# Patient Record
Sex: Female | Born: 1980 | Race: Black or African American | Hispanic: No | Marital: Married | Smoking: Never smoker
Health system: Southern US, Community
[De-identification: ages and names within clinical notes are randomized; demographics above are authoritative.]

## PROBLEM LIST (undated history)

## (undated) DIAGNOSIS — Z6827 Body mass index (BMI) 27.0-27.9, adult: Secondary | ICD-10-CM

## (undated) HISTORY — DX: Body mass index (BMI) 27.0-27.9, adult: Z68.27

## (undated) HISTORY — PX: TUBAL LIGATION: SHX77

---
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
## 2002-05-17 ENCOUNTER — Inpatient Hospital Stay (HOSPITAL_COMMUNITY): Admission: AD | Admit: 2002-05-17 | Discharge: 2002-05-17 | Payer: Self-pay | Admitting: Obstetrics

## 2002-05-31 ENCOUNTER — Inpatient Hospital Stay (HOSPITAL_COMMUNITY): Admission: AD | Admit: 2002-05-31 | Discharge: 2002-06-02 | Payer: Self-pay | Admitting: Obstetrics

## 2003-09-06 ENCOUNTER — Emergency Department (HOSPITAL_COMMUNITY): Admission: AD | Admit: 2003-09-06 | Discharge: 2003-09-06 | Payer: Self-pay | Admitting: Family Medicine

## 2004-11-10 ENCOUNTER — Emergency Department (HOSPITAL_COMMUNITY): Admission: EM | Admit: 2004-11-10 | Discharge: 2004-11-10 | Payer: Self-pay | Admitting: Emergency Medicine

## 2004-11-12 ENCOUNTER — Ambulatory Visit: Payer: Self-pay | Admitting: Obstetrics & Gynecology

## 2004-11-26 ENCOUNTER — Ambulatory Visit: Payer: Self-pay | Admitting: Obstetrics & Gynecology

## 2004-11-26 ENCOUNTER — Encounter (INDEPENDENT_AMBULATORY_CARE_PROVIDER_SITE_OTHER): Payer: Self-pay | Admitting: Specialist

## 2005-01-18 ENCOUNTER — Inpatient Hospital Stay (HOSPITAL_COMMUNITY): Admission: AD | Admit: 2005-01-18 | Discharge: 2005-01-18 | Payer: Self-pay | Admitting: Obstetrics

## 2005-01-20 ENCOUNTER — Inpatient Hospital Stay (HOSPITAL_COMMUNITY): Admission: AD | Admit: 2005-01-20 | Discharge: 2005-01-20 | Payer: Self-pay | Admitting: Obstetrics

## 2005-01-22 ENCOUNTER — Inpatient Hospital Stay (HOSPITAL_COMMUNITY): Admission: EM | Admit: 2005-01-22 | Discharge: 2005-01-22 | Payer: Self-pay | Admitting: Obstetrics

## 2005-01-24 ENCOUNTER — Inpatient Hospital Stay (HOSPITAL_COMMUNITY): Admission: AD | Admit: 2005-01-24 | Discharge: 2005-01-24 | Payer: Self-pay | Admitting: Obstetrics

## 2005-01-26 ENCOUNTER — Inpatient Hospital Stay (HOSPITAL_COMMUNITY): Admission: AD | Admit: 2005-01-26 | Discharge: 2005-01-26 | Payer: Self-pay | Admitting: Obstetrics

## 2006-02-21 ENCOUNTER — Inpatient Hospital Stay (HOSPITAL_COMMUNITY): Admission: AD | Admit: 2006-02-21 | Discharge: 2006-02-21 | Payer: Self-pay | Admitting: Obstetrics

## 2006-02-26 ENCOUNTER — Inpatient Hospital Stay (HOSPITAL_COMMUNITY): Admission: AD | Admit: 2006-02-26 | Discharge: 2006-02-26 | Payer: Self-pay | Admitting: Obstetrics

## 2006-03-01 ENCOUNTER — Inpatient Hospital Stay (HOSPITAL_COMMUNITY): Admission: AD | Admit: 2006-03-01 | Discharge: 2006-03-04 | Payer: Self-pay | Admitting: Obstetrics

## 2006-05-06 ENCOUNTER — Encounter (INDEPENDENT_AMBULATORY_CARE_PROVIDER_SITE_OTHER): Payer: Self-pay | Admitting: *Deleted

## 2006-05-06 ENCOUNTER — Ambulatory Visit (HOSPITAL_COMMUNITY): Admission: RE | Admit: 2006-05-06 | Discharge: 2006-05-06 | Payer: Self-pay

## 2006-07-30 IMAGING — US US OB COMP LESS 14 WK
1 series · 14 of 28 positions shown · non-contrast
Comparison: none

CLINICAL DATA: 9 weeks pregnant and bleeding.
FIRST TRIMESTER OB ULTRASOUND AND ULTRASOUND TRANSVAGINAL:
Multiple longitudinal and transverse images were obtained.  Gestational age by last menstrual period would be 9 weeks and 6 days.  The uterus appears to have an ill-defined gestational sac approximately 10 x 0.4 x 0.3 cm.   I see no yolk sac or embryo.  Crown rump length cannot be measured.  There is no subchorionic hemorrhage or increased uterine flow.  The right ovary is normal showing a corpus luteum cyst.  The left ovary is normal.   There is no free fluid.

[Series 1: us ob comp less 14 wk · 0.29mm/px · 14 of 48 slices shown]
[im 2/48]
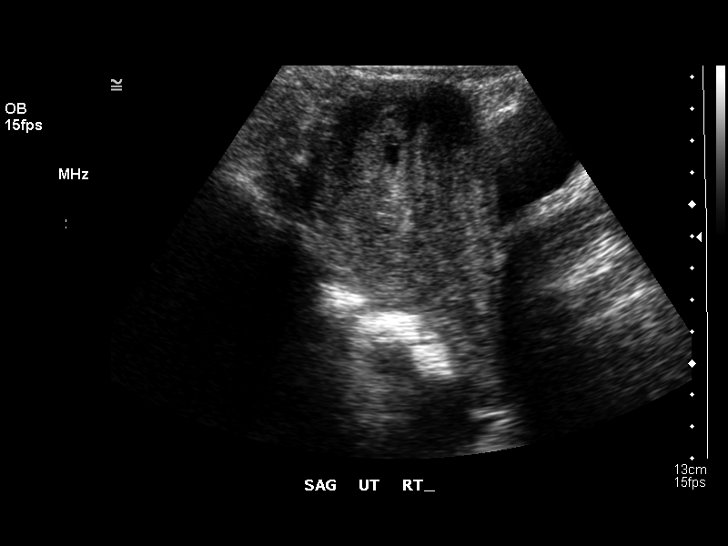
[im 6/48]
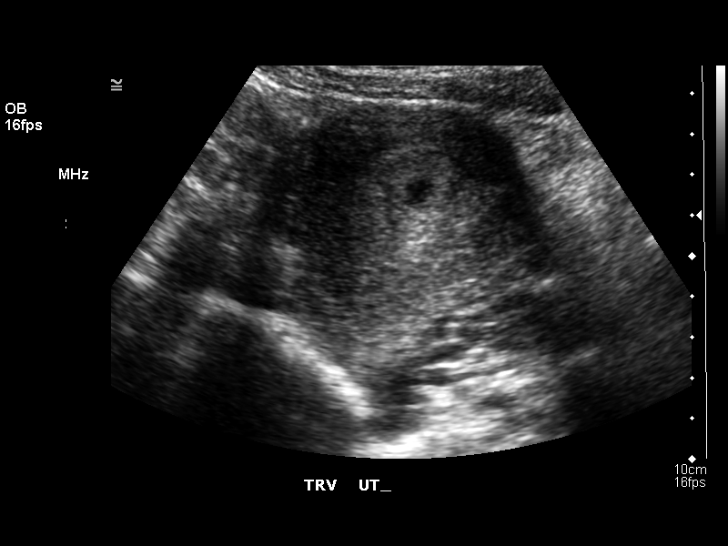
[im 9/48]
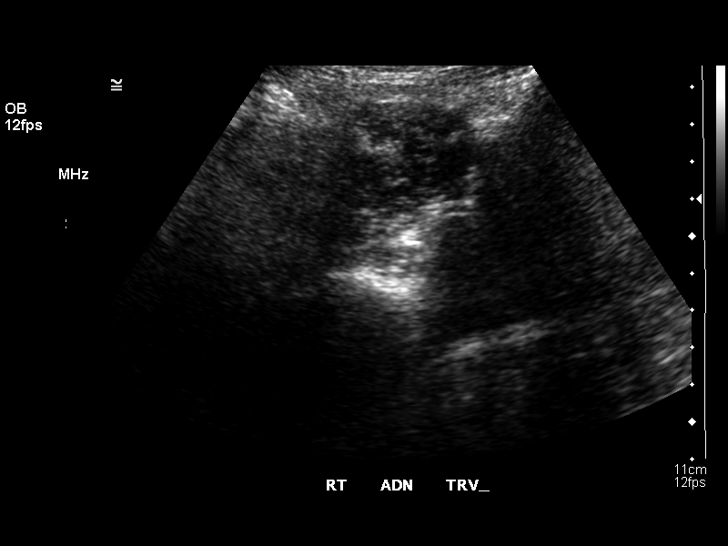
[im 13/48]
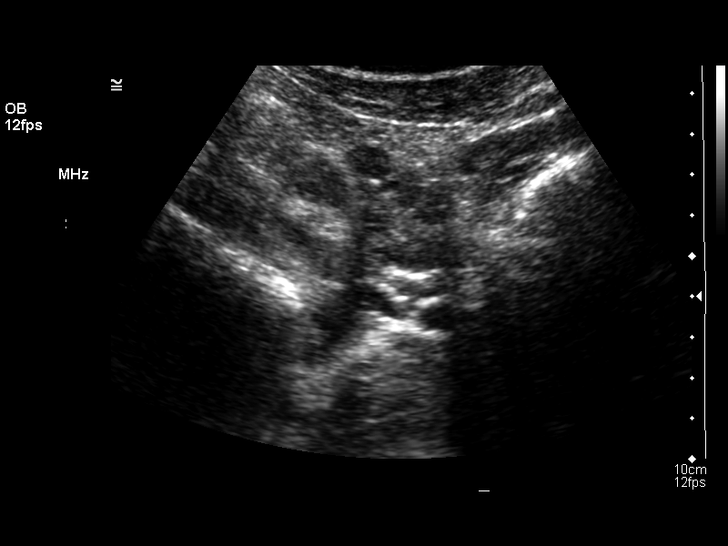
[im 16/48]
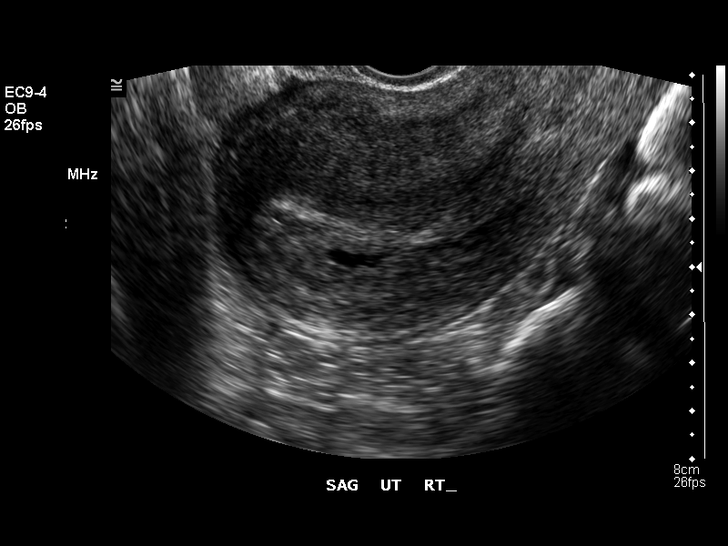
[im 20/48]
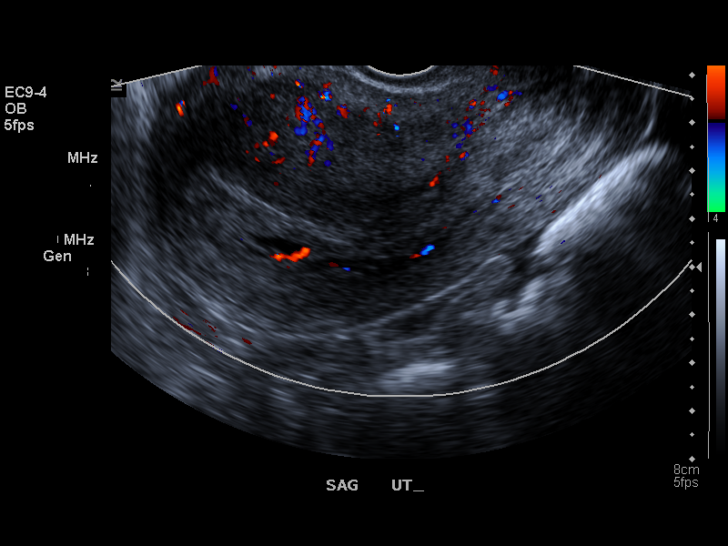
[im 23/48]
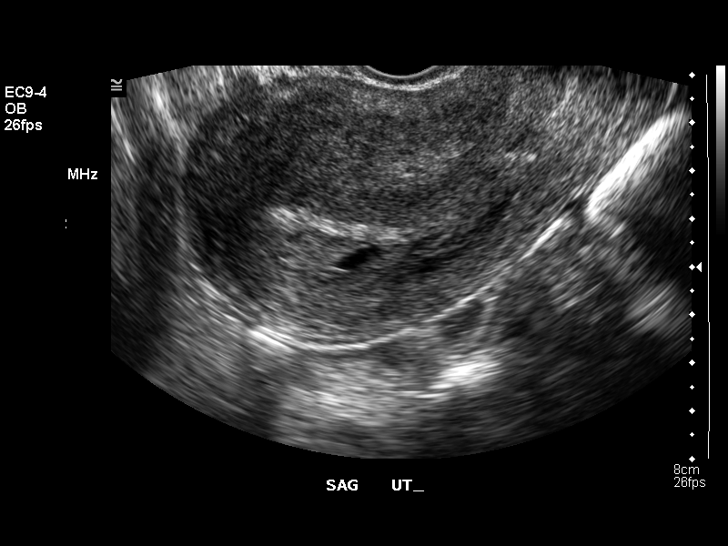
[im 27/48]
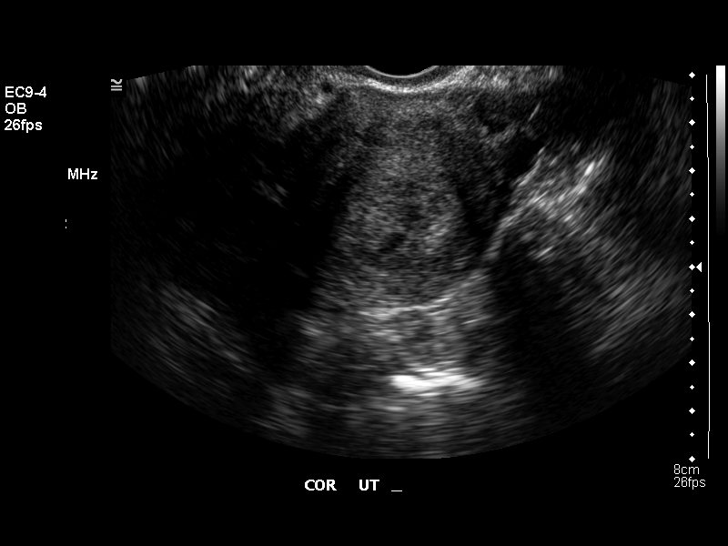
[im 30/48]
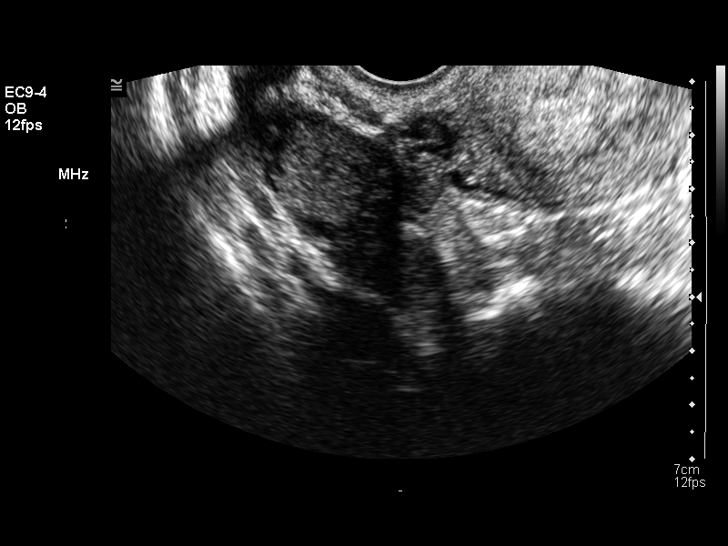
[im 34/48]
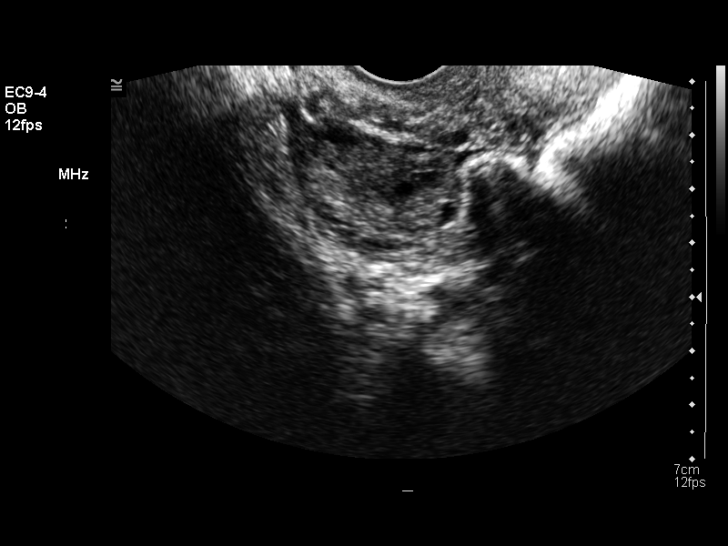
[im 37/48]
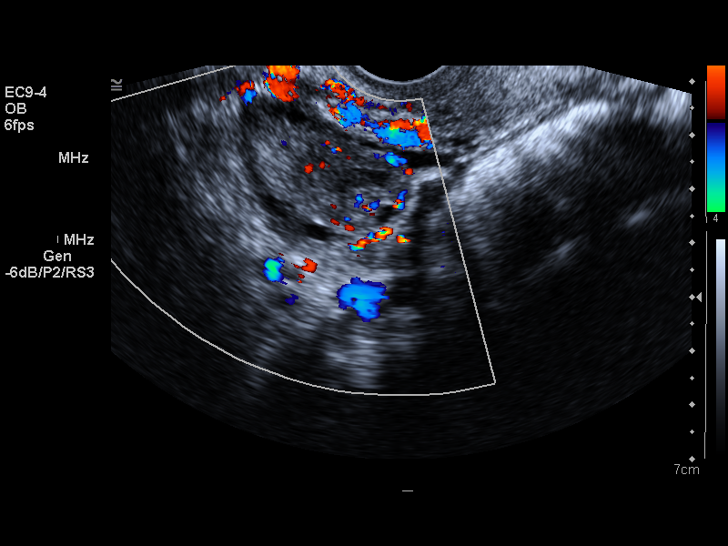
[im 41/48]
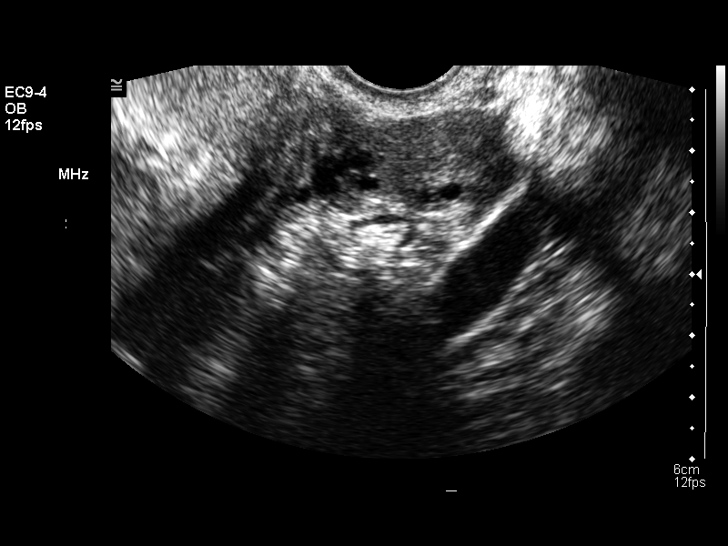
[im 44/48]
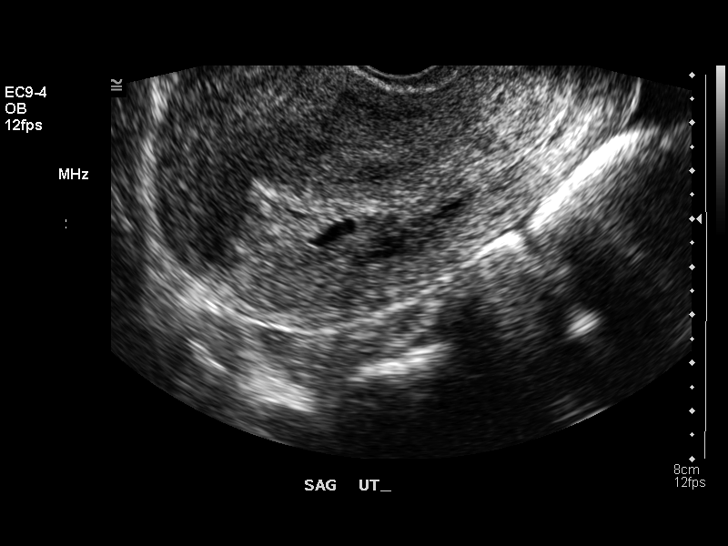
[im 48/48]
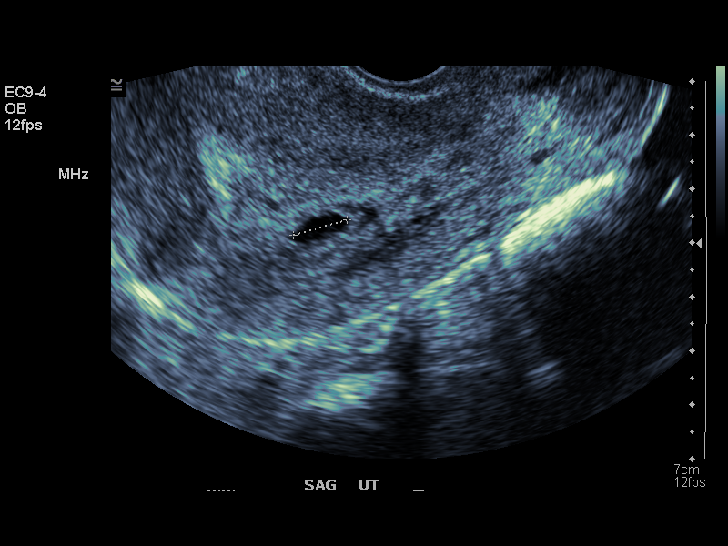

[14 of 28 positions shown; findings below may reference images not displayed]

IMPRESSION: Cannot confirm intrauterine pregnancy based on this ultrasound.   Recommend serial quantitative HCGs as well as serial repeat ultrasounds to determine if this is a blighted ovum or a very early IUP.

## 2006-08-03 IMAGING — US US OB TRANSVAGINAL
1 series · 14 of 28 positions shown · non-contrast
Comparison: none

HISTORY: Pregnant, spotting

[Series 1: us ob transvaginal · 0.23mm/px · 14 of 29 slices shown]
[im 2/29]
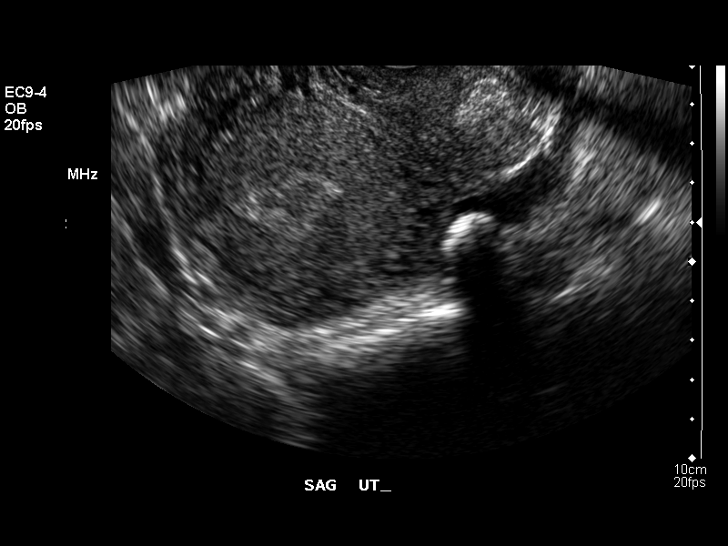
[im 4/29]
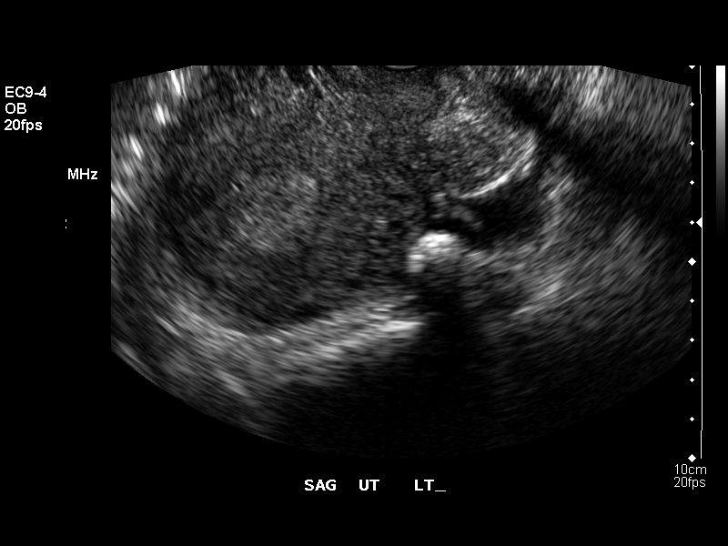
[im 6/29]
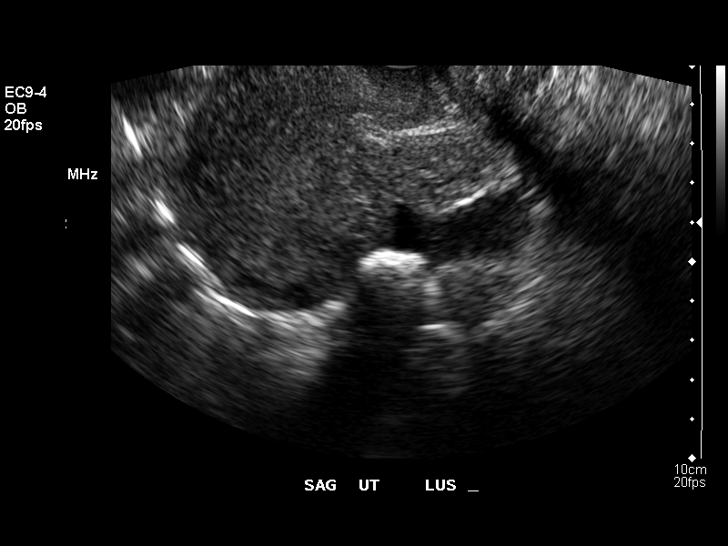
[im 8/29]
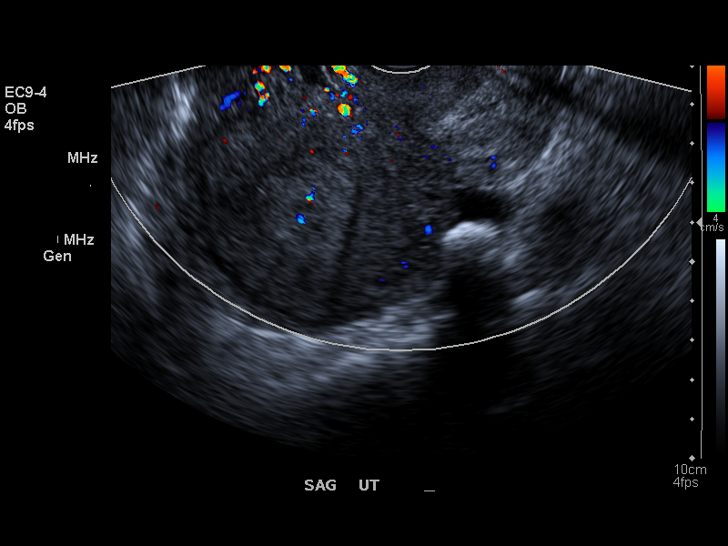
[im 10/29]
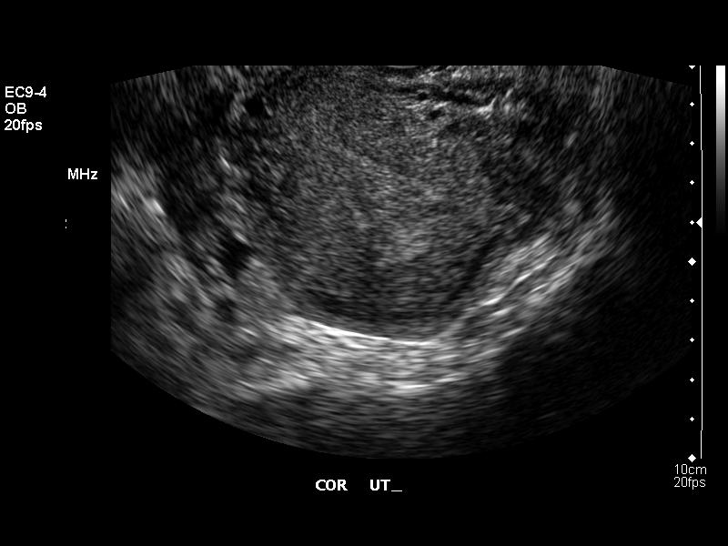
[im 12/29]
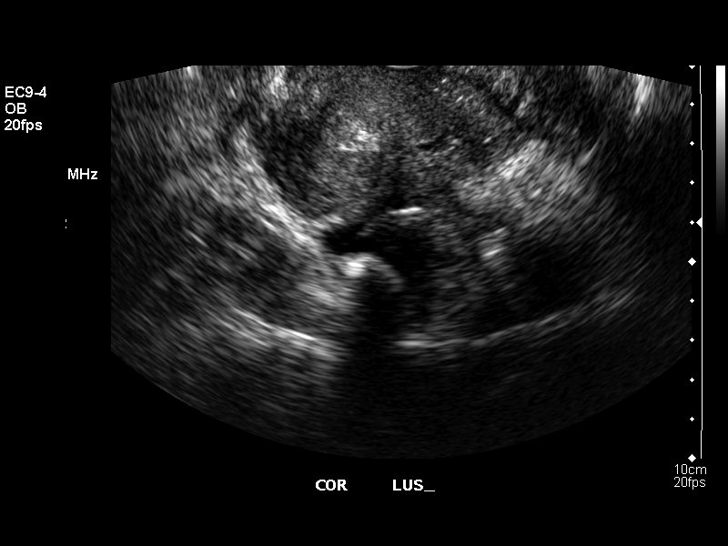
[im 14/29]
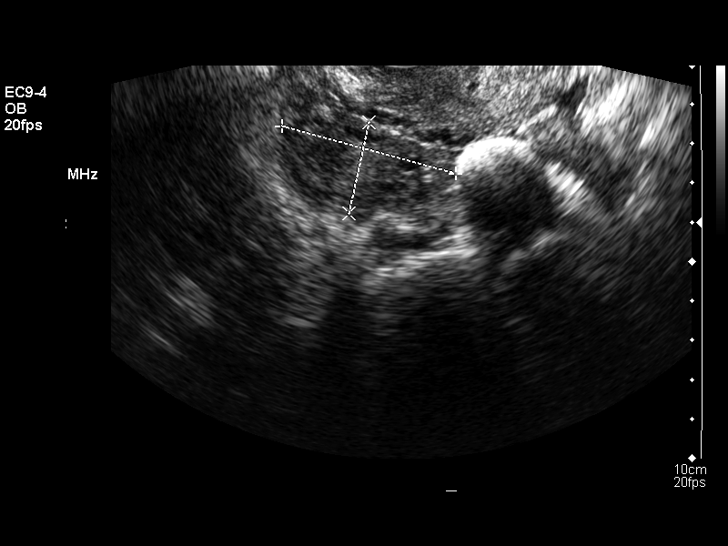
[im 16/29]
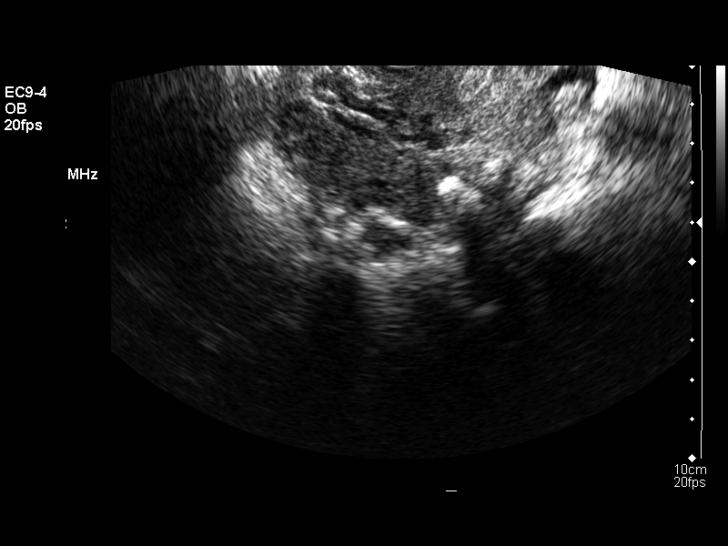
[im 18/29]
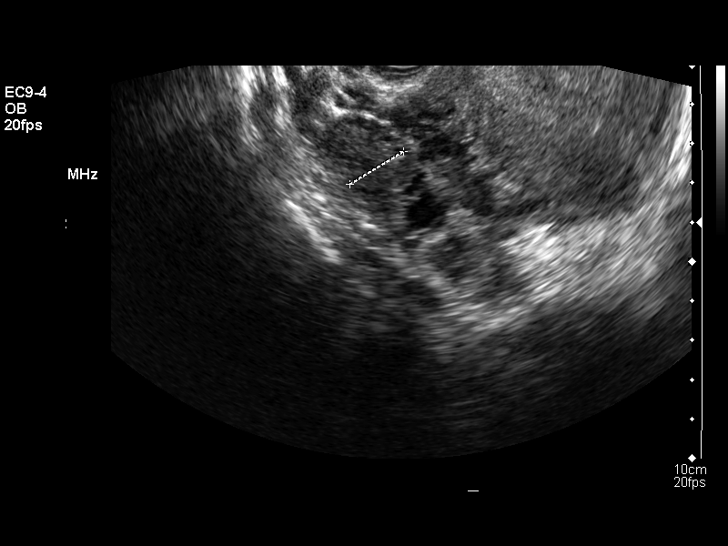
[im 20/29]
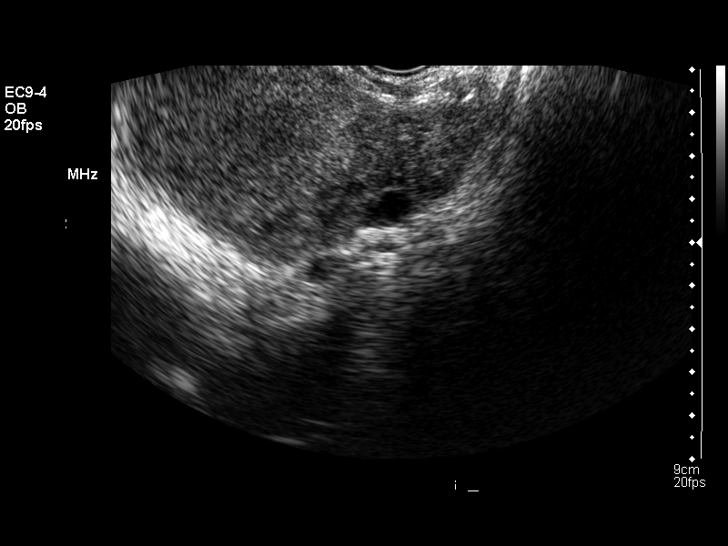
[im 22/29]
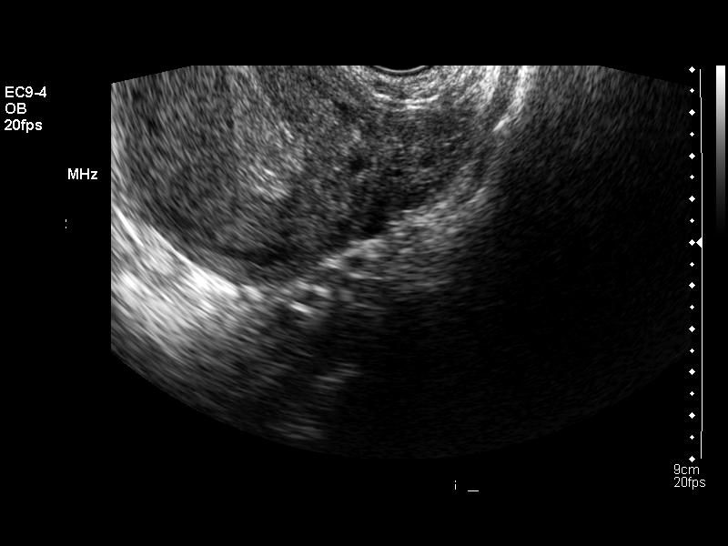
[im 24/29]
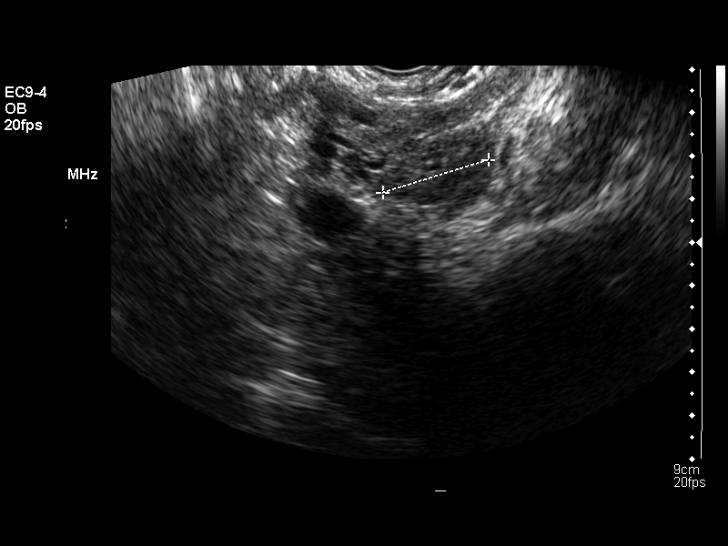
[im 26/29]
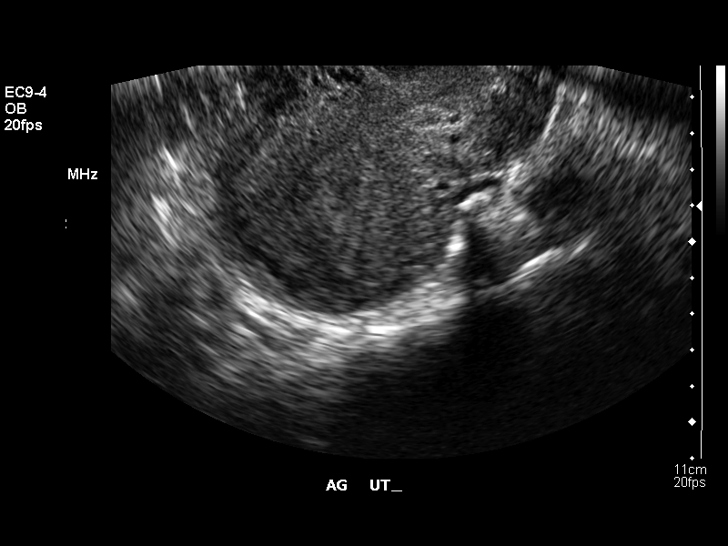
[im 29/29]
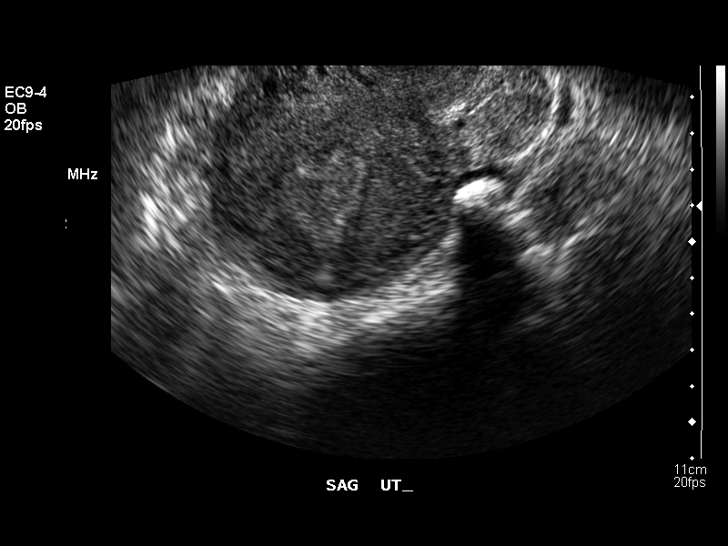

[14 of 28 positions shown; findings below may reference images not displayed]

ULTRASOUND OB TRANSVAGINAL:

Transvaginal sonography of the pregnant pelvis performed.
Comparison 01/22/2005

No gestational sac is seen within the uterus.
The tiny endometrial fluid collection or sac seen on the previous study is no
longer identified.
Endometrial stripe is prominent, measuring up to 2.1 cm thick and showing blood
flow on color Doppler imaging.
No evidence of uterine mass.
Small amount of free pelvic fluid.
Lower uterine segment normal.
No adnexal masses.

Right ovary normal size and morphology, 4.6 x 2.4 x 1.6 cm.
Left ovary normal size and morphology, 4.0 x 2.5 x 2.6 cm.
IMPRESSION: No evidence of each uterine gestation. The tiny sac identified within the
endometrial canal on the previous study is no longer seen, question spontaneous
abortion. Correlation with serial quantitative beta-hCG levels recommended to
confirm spontaneous abortion and exclude ectopic pregnancy.
Small amount of nonspecific free pelvic fluid.

## 2008-08-03 ENCOUNTER — Inpatient Hospital Stay (HOSPITAL_COMMUNITY): Admission: AD | Admit: 2008-08-03 | Discharge: 2008-08-03 | Payer: Self-pay | Admitting: *Deleted

## 2008-08-08 ENCOUNTER — Inpatient Hospital Stay (HOSPITAL_COMMUNITY): Admission: AD | Admit: 2008-08-08 | Discharge: 2008-08-10 | Payer: Self-pay | Admitting: Obstetrics

## 2008-09-28 ENCOUNTER — Emergency Department (HOSPITAL_COMMUNITY): Admission: EM | Admit: 2008-09-28 | Discharge: 2008-09-28 | Payer: Self-pay | Admitting: Family Medicine

## 2010-11-10 ENCOUNTER — Encounter: Payer: Self-pay | Admitting: Obstetrics

## 2010-12-11 ENCOUNTER — Inpatient Hospital Stay (HOSPITAL_COMMUNITY)
Admission: RE | Admit: 2010-12-11 | Discharge: 2010-12-13 | DRG: 767 | Disposition: A | Discharge status: Home / Self Care | Payer: Medicaid Other | Source: Ambulatory Visit | Attending: Obstetrics | Admitting: Obstetrics

## 2010-12-11 DIAGNOSIS — Z302 Encounter for sterilization: Secondary | ICD-10-CM

## 2010-12-11 LAB — CBC
HCT: 32 % — ABNORMAL LOW (ref 36.0–46.0)
MCH: 30 pg (ref 26.0–34.0)
MCHC: 33.1 g/dL (ref 30.0–36.0)
MCV: 90.7 fL (ref 78.0–100.0)
Platelets: 201 10*3/uL (ref 150–400)
RBC: 3.53 MIL/uL — ABNORMAL LOW (ref 3.87–5.11)
RDW: 13.8 % (ref 11.5–15.5)

## 2010-12-11 LAB — RPR: RPR Ser Ql: NONREACTIVE

## 2010-12-12 ENCOUNTER — Other Ambulatory Visit: Payer: Self-pay | Admitting: Obstetrics

## 2010-12-12 LAB — CBC
HCT: 30.2 % — ABNORMAL LOW (ref 36.0–46.0)
Hemoglobin: 9.9 g/dL — ABNORMAL LOW (ref 12.0–15.0)
MCH: 30 pg (ref 26.0–34.0)
MCV: 91.5 fL (ref 78.0–100.0)
RBC: 3.3 MIL/uL — ABNORMAL LOW (ref 3.87–5.11)
WBC: 8.6 10*3/uL (ref 4.0–10.5)

## 2010-12-18 NOTE — Op Note (Signed)
  NAME:  Megan Bryan, Megan Bryan          ACCOUNT NO.:  [id number]  MEDICAL RECORD NO.:  [id number]           PATIENT TYPE:  I  LOCATION:  9148                          FACILITY:  [facility]  PHYSICIAN:  Kathreen Cosier, M.D.DATE OF BIRTH:  09-21-1981  DATE OF PROCEDURE:  12/12/2010 DATE OF DISCHARGE:                              OPERATIVE REPORT   PREOPERATIVE DIAGNOSIS:  Multiparity for postpartum tubal ligation.  POSTOPERATIVE DIAGNOSIS:  Multiparity for postpartum tubal ligation.  SURGEON:  Kathreen Cosier, MD  ANESTHESIA:  Epidural.  PROCEDURE IN DETAIL:  The patient was placed on the operating table in supine position after the epidural dosed.  Abdomen prepped and draped and bladder emptied with Foley catheter.  Midline subumbilical incision 1-inch long was made and carried down to the fascia.  Fascia cleaned, grasped with 2 Kochers and the fascia and the peritoneum with Mayo scissors.  Left tube grasped in midportion with Babcock clamp.  Zero plain suture placed in the mesosalpinx below the portion of tube within the clamp.  This was tied and approximately 1 inch of tube transected. Procedure done in a similar fashion to the other side.  Hemostasis was satisfactory.  Abdomen closed in layers, peritoneum and fascia continuous suture with Dexon, skin closed with subcuticular stitch of 4- 0 Monocryl.  Blood loss minimal.  The patient tolerated the procedure well and taken to recovery room in good condition.          ______________________________ Kathreen Cosier, M.D.     BAM/MEDQ  D:  12/12/2010  T:  12/13/2010  Job:  [id number]  Electronically Signed by Francoise Ceo M.D. on 12/18/2010 08:29:43 AM

## 2010-12-20 ENCOUNTER — Inpatient Hospital Stay (HOSPITAL_COMMUNITY): Admission: AD | Admit: 2010-12-20 | Payer: Self-pay | Admitting: Obstetrics

## 2011-03-07 NOTE — Group Therapy Note (Signed)
NAME:  Megan Bryan, Megan Bryan              ACCOUNT NO.:  [id number]   MEDICAL RECORD NO.:  [id number]          PATIENT TYPE:  WOC   LOCATION:  [facility]                   FACILITY:  [facility]   PHYSICIAN:  Elsie Lincoln, MD      DATE OF BIRTH:  11-Nov-1980   DATE OF SERVICE:  11/26/2004                                    CLINIC NOTE   The patient is a 30 year old G2 P2002, LMP November 11, 2004, who presents  for Pap smear and yearly exams.  The patient denies any problems.  The  patient was here on November 12, 2004, with complaints of nausea, vomiting  and dizziness.  You can see the previous clinic note for all of the details.  She was referred to the [facility] for these problems, where she did not go; she  feels fine today.  Also, she said she had gone to [facility] in the  past and had abnormal Pap smears there.  We called [facility] but  they said she has never been a patient.  When further questioned today she  states it probably was __________, we are now requesting those records.  We  will proceed with Pap smear today.   MEDICAL HISTORY AS FOLLOWS:   PAST MEDICAL HISTORY:  None.   PAST GYN HISTORY:  Two normal spontaneous vaginal deliveries at term with no  problems; no history of ovarian cyst, fibroid tumors; she has had Chlamydia  3-4 years ago; she is a monogamous relationship, using condoms for birth  control.  She is not interested in using any other method, even after  counseling today.   MEDICATIONS:  None.   ALLERGIES:  NONE.   REVIEW OF SYMPTOMS:  Negative.   PHYSICAL EXAM:  Temperature 97.8, pulse 87, blood pressure 108/71, weight  132.1, height 5 feet 3 inches.  ABDOMEN:  Soft, nontender, nondistended.  GENITALIA:  Tanner 5.  VAGINA:  Pink, normal rugae, no blood or discharge.  CERVIX:  Closed, nontender.  UTERUS:  Anteverted, small, nontender.  ADNEXA:  Both ovaries palpated, small and nontender.   ASSESSMENT AND PLAN:  A 30 year old female for yearly  exam.  1.  Pap smear and cultures for GC and Chlamydia were done.  2.  Records requested from __________.  3.  If patient decides to become pregnant again, patient told to take      prenatal vitamins one month prior to conception.  4.  Return to clinic in one year.  5.  Will call patient if Pap smear is abnormal.      KL/MEDQ  D:  11/26/2004  T:  11/26/2004  Job:  [id number]

## 2011-03-07 NOTE — Op Note (Signed)
NAME:  Megan Bryan, Megan Bryan NO.:  [id number]   MEDICAL RECORD NO.:  [id number]          PATIENT TYPE:  AMB   LOCATION:  [facility]                           [facility]   PHYSICIAN:  Kathreen Cosier, M.D.DATE OF BIRTH:  [birth year]   DATE OF PROCEDURE:  05/06/2006  DATE OF DISCHARGE:                                 OPERATIVE REPORT   PREOPERATIVE DIAGNOSIS:  Severe dysplasia of cervix.   PROCEDURE:  Cervical conization.   ANESTHESIA:  General anesthesia.   DESCRIPTION OF PROCEDURE:  Under general anesthesia, patient in lithotomy  position, peritoneum and vagina are prepped and draped, bladder emptied with  straight catheter.  Bimanual exam revealed the uterus to be normal size.  Weighted speculum placed in the vagina.  Cervix was grasped with Allis clamp  and cervix injected with 10 mL of 1% Xylocaine.  A #1 chromic suture was  placed at 3 o'clock and 9 o'clock high upon the lateral aspect of the cervix  for hemostasis.  Then a cold knife cone done in the usual manner and  hemostasis achieved with U sutures around the cervix.  The cervical canal  easily admitted sound.  Patient tolerated the procedure well and was taken  to the recovery room in good condition.           ______________________________  Kathreen Cosier, M.D.     BAM/MEDQ  D:  05/06/2006  T:  05/06/2006  Job:  [id number]

## 2011-03-07 NOTE — Group Therapy Note (Signed)
NAME:  Megan Bryan, Megan Bryan              ACCOUNT NO.:  [id number]   MEDICAL RECORD NO.:  [id number]          PATIENT TYPE:  WOC   LOCATION:  [facility]                   [facility]ACILITY:  [facility]   PHYSICIAN:  Elsie Lincoln, MD      DATE OF BIRTH:  [birth year]   DATE OF SERVICE:  11/12/2004                                    CLINIC NOTE   Patient is a 30 year old female who presents for nausea, vomiting,  dizziness.  She thinks that we are a primary care clinic that deals with  these type things.  I explained that we are a gynecologist and that we will  be addressing her GYN needs.  She is bleeding currently so we did rule out  pregnancy as a cause for dizziness and vomiting.  She had UCG negative.  Her  blood pressure today is 115/78, pulse 88, temperature 98.3.  Patient needs  to come back for a Pap smear in two weeks when she is not bleeding.  She  says she also has a history of abnormal Pap smear and she was recommended an  LEEP by [facility].  She did not get this done.  She was scared.  This  happened almost a year ago.  She requested records to have these available  at her visit in two weeks.  Patient is to go to MAU now.      KL/MEDQ  D:  11/12/2004  T:  11/13/2004  Job:  [id number]

## 2011-04-10 ENCOUNTER — Inpatient Hospital Stay (INDEPENDENT_AMBULATORY_CARE_PROVIDER_SITE_OTHER)
Admission: RE | Admit: 2011-04-10 | Discharge: 2011-04-10 | Disposition: A | Discharge status: Home / Self Care | Payer: Medicaid Other | Source: Ambulatory Visit | Attending: Family Medicine | Admitting: Family Medicine

## 2011-04-10 DIAGNOSIS — N390 Urinary tract infection, site not specified: Secondary | ICD-10-CM

## 2011-04-10 LAB — POCT URINALYSIS DIP (DEVICE)
Glucose, UA: NEGATIVE mg/dL
Ketones, ur: NEGATIVE mg/dL
Nitrite: POSITIVE — AB
Specific Gravity, Urine: 1.02 (ref 1.005–1.030)
pH: 6.5 (ref 5.0–8.0)

## 2011-04-10 LAB — POCT PREGNANCY, URINE: Preg Test, Ur: NEGATIVE

## 2011-07-21 LAB — CBC
HCT: 30.7 — ABNORMAL LOW
Hemoglobin: 10.6 — ABNORMAL LOW
MCHC: 34.4
MCHC: 34.4
MCV: 94.5
Platelets: 194
RBC: 3.25 — ABNORMAL LOW
RBC: 3.58 — ABNORMAL LOW
RDW: 14.3
WBC: 8

## 2011-07-21 LAB — RPR: RPR Ser Ql: NONREACTIVE

## 2014-01-30 LAB — LAB REPORT - SCANNED: Pap: NEGATIVE

## 2016-01-22 ENCOUNTER — Encounter (HOSPITAL_COMMUNITY): Payer: Self-pay | Admitting: Emergency Medicine

## 2016-01-22 ENCOUNTER — Ambulatory Visit (HOSPITAL_COMMUNITY)
Admission: EM | Admit: 2016-01-22 | Discharge: 2016-01-22 | Disposition: A | Discharge status: Home / Self Care | Payer: BLUE CROSS/BLUE SHIELD | Source: Home / Self Care

## 2016-01-22 ENCOUNTER — Other Ambulatory Visit (HOSPITAL_COMMUNITY)
Admission: RE | Admit: 2016-01-22 | Discharge: 2016-01-22 | Disposition: A | Discharge status: Home / Self Care | Payer: BLUE CROSS/BLUE SHIELD | Source: Ambulatory Visit | Attending: Emergency Medicine | Admitting: Emergency Medicine

## 2016-01-22 DIAGNOSIS — J111 Influenza due to unidentified influenza virus with other respiratory manifestations: Secondary | ICD-10-CM

## 2016-01-22 LAB — POCT RAPID STREP A: Streptococcus, Group A Screen (Direct): NEGATIVE

## 2016-01-22 MED ORDER — ACETAMINOPHEN 325 MG PO TABS
650.0000 mg | ORAL_TABLET | Freq: Once | ORAL | Status: AC
  Administered 2016-01-22: 650 mg via ORAL

## 2016-01-22 MED ORDER — OSELTAMIVIR PHOSPHATE 75 MG PO CAPS: 75.0000 mg | ORAL_CAPSULE | Freq: Two times a day (BID) | ORAL | Status: DC

## 2016-01-22 MED ORDER — ACETAMINOPHEN 325 MG PO TABS
ORAL_TABLET | ORAL | Status: AC
  Filled 2016-01-22: qty 2

## 2016-01-22 NOTE — Discharge Instructions (Signed)

## 2016-01-22 NOTE — ED Provider Notes (Signed)
CSN: [id number]     Arrival date & time 01/22/16  1850 History   None    Chief Complaint  Patient presents with  . Sore Throat  . Fever  . Generalized Body Aches   (Consider location/radiation/quality/duration/timing/severity/associated sxs/prior Treatment) Patient is a 35 y.o. female presenting with pharyngitis and fever. The history is provided by the patient. The history is limited by a language barrier.  Sore Throat This is a new problem. The current episode started 2 days ago. The problem occurs constantly. The problem has not changed since onset.Associated symptoms include headaches. The symptoms are aggravated by swallowing. Nothing relieves the symptoms. She has tried water for the symptoms.  Fever Associated symptoms: headaches and sore throat     History reviewed. No pertinent past medical history. Past Surgical History  Procedure Laterality Date  . Tubal ligation     History reviewed. No pertinent family history. Social History  Substance Use Topics  . Smoking status: Never Smoker   . Smokeless tobacco: None  . Alcohol Use: No   OB History    No data available     Review of Systems  Constitutional: Positive for fever.  HENT: Positive for sore throat.   Eyes: Negative.   Respiratory: Negative.   Cardiovascular: Negative.   Endocrine: Negative.   Genitourinary: Negative.   Musculoskeletal: Negative.   Skin: Negative.   Allergic/Immunologic: Negative.   Neurological: Positive for headaches.  Hematological: Negative.   Psychiatric/Behavioral: Negative.     Allergies  Review of patient's allergies indicates no known allergies.  Home Medications   Prior to Admission medications   Not on File   Meds Ordered and Administered this Visit  Medications - No data to display  BP 116/77 mmHg  Pulse 102  Temp(Src) 100.5 F (38.1 C) (Oral)  Resp 16  SpO2 98%  LMP 01/19/2016 (Exact Date) No data found.   Physical Exam  Constitutional: She is oriented to  person, place, and time. She appears well-developed and well-nourished.  Ill appearing with fever  HENT:  Head: Normocephalic and atraumatic.  Right Ear: External ear normal.  Left Ear: External ear normal.  Opx injected w/o exudates  Eyes: Conjunctivae and EOM are normal. Pupils are equal, round, and reactive to light.  Neck: Normal range of motion. Neck supple.  Cardiovascular: Normal rate, regular rhythm and normal heart sounds.   Pulmonary/Chest: Effort normal and breath sounds normal.  Abdominal: Soft. Bowel sounds are normal.  Musculoskeletal: Normal range of motion.  Neurological: She is alert and oriented to person, place, and time.    ED Course  Procedures (including critical care time)  Labs Review Labs Reviewed - No data to display  Imaging Review No results found.   Visual Acuity Review  Right Eye Distance:   Left Eye Distance:   Bilateral Distance:    Right Eye Near:   Left Eye Near:    Bilateral Near:         MDM  Influenza - Tamiflu 75mg  one po bid x 5 days #10. Push po fluids, rest, tylenol and motrin otc prn as directed for fever, arthralgias, and myalgias.  Follow up prn if sx's continue or persist. Tylenol 650mg  here in clinic.   Deatra CanterWilliam J Oxford, FNP 01/22/16 2112

## 2016-01-22 NOTE — ED Notes (Signed)
[facility] a [facility] fever, sore throat and general body aches x 3 days. The patient has tried multiple OTC meds with no relief.

## 2016-01-25 LAB — CULTURE, GROUP A STREP (THRC)

## 2020-06-19 ENCOUNTER — Ambulatory Visit: Payer: Self-pay

## 2020-06-24 ENCOUNTER — Other Ambulatory Visit: Payer: Self-pay | Admitting: Physician Assistant

## 2020-06-24 ENCOUNTER — Encounter: Payer: Self-pay | Admitting: Physician Assistant

## 2020-06-24 DIAGNOSIS — Z6827 Body mass index (BMI) 27.0-27.9, adult: Secondary | ICD-10-CM | POA: Insufficient documentation

## 2020-06-24 NOTE — Progress Notes (Signed)
I connected by phone with Megan Bryan on 06/24/2020 at 2:30 PM to discuss the potential use of a new treatment for mild to moderate COVID-19 viral infection in non-hospitalized patients.  This patient is a 39 y.o. female that meets the FDA criteria for Emergency Use Authorization of COVID monoclonal antibody casirivimab/imdevimab.  Has a (+) direct SARS-CoV-2 viral test result  Has mild or moderate COVID-19   Is NOT hospitalized due to COVID-19  Is within 10 days of symptom onset  Has at least one of the high risk factor(s) for progression to severe COVID-19 and/or hospitalization as defined in EUA.  Specific high risk criteria : BMI > 25   I have spoken and communicated the following to the patient or parent/caregiver regarding COVID monoclonal antibody treatment:  1. FDA has authorized the emergency use for the treatment of mild to moderate COVID-19 in adults and pediatric patients with positive results of direct SARS-CoV-2 viral testing who are 54 years of age and older weighing at least 40 kg, and who are at high risk for progressing to severe COVID-19 and/or hospitalization.  2. The significant known and potential risks and benefits of COVID monoclonal antibody, and the extent to which such potential risks and benefits are unknown.  3. Information on available alternative treatments and the risks and benefits of those alternatives, including clinical trials.  4. Patients treated with COVID monoclonal antibody should continue to self-isolate and use infection control measures (e.g., wear mask, isolate, social distance, avoid sharing personal items, clean and disinfect "high touch" surfaces, and frequent handwashing) according to CDC guidelines.   5. The patient or parent/caregiver has the option to accept or refuse COVID monoclonal antibody treatment.  After reviewing this information with the patient, The patient agreed to proceed with receiving casirivimab\imdevimab infusion  and will be provided a copy of the Fact sheet prior to receiving the infusion.  Sx onset 8/27. Set up for infusion on 9/6 @ 10:30am. Directions given to [facility]. Pt is aware that insurance will be charged an infusion fee. Pt is not vaccinated.   Cline Crock 06/24/2020 2:30 PM

## 2020-06-25 ENCOUNTER — Ambulatory Visit (HOSPITAL_COMMUNITY)
Admission: RE | Admit: 2020-06-25 | Discharge: 2020-06-25 | Disposition: A | Discharge status: Home / Self Care | Payer: HRSA Program | Source: Ambulatory Visit | Attending: Pulmonary Disease | Admitting: Pulmonary Disease

## 2020-06-25 DIAGNOSIS — U071 COVID-19: Secondary | ICD-10-CM | POA: Insufficient documentation

## 2020-06-25 DIAGNOSIS — Z6825 Body mass index (BMI) 25.0-25.9, adult: Secondary | ICD-10-CM | POA: Insufficient documentation

## 2020-06-25 MED ORDER — FAMOTIDINE IN NACL 20-0.9 MG/50ML-% IV SOLN: 20.0000 mg | Freq: Once | INTRAVENOUS | Status: DC | PRN

## 2020-06-25 MED ORDER — SODIUM CHLORIDE 0.9 % IV SOLN
1200.0000 mg | Freq: Once | INTRAVENOUS | Status: AC
  Administered 2020-06-25: 1200 mg via INTRAVENOUS
  Filled 2020-06-25: qty 10

## 2020-06-25 MED ORDER — METHYLPREDNISOLONE SODIUM SUCC 125 MG IJ SOLR: 125.0000 mg | Freq: Once | INTRAMUSCULAR | Status: DC | PRN

## 2020-06-25 MED ORDER — SODIUM CHLORIDE 0.9 % IV SOLN: INTRAVENOUS | Status: DC | PRN

## 2020-06-25 MED ORDER — EPINEPHRINE 0.3 MG/0.3ML IJ SOAJ: 0.3000 mg | Freq: Once | INTRAMUSCULAR | Status: DC | PRN

## 2020-06-25 MED ORDER — DIPHENHYDRAMINE HCL 50 MG/ML IJ SOLN: 50.0000 mg | Freq: Once | INTRAMUSCULAR | Status: DC | PRN

## 2020-06-25 MED ORDER — ALBUTEROL SULFATE HFA 108 (90 BASE) MCG/ACT IN AERS: 2.0000 | INHALATION_SPRAY | Freq: Once | RESPIRATORY_TRACT | Status: DC | PRN

## 2020-06-25 NOTE — Discharge Instructions (Signed)
10 Things You Can Do to Manage Your COVID-19 Symptoms at Home If you have possible or confirmed COVID-19: 1. Stay home from work and school. And stay away from other public places. If you must go out, avoid using any kind of public transportation, ridesharing, or taxis. 2. Monitor your symptoms carefully. If your symptoms get worse, call your healthcare provider immediately. 3. Get rest and stay hydrated. 4. If you have a medical appointment, call the healthcare provider ahead of time and tell them that you have or may have COVID-19. 5. For medical emergencies, call 911 and notify the dispatch personnel that you have or may have COVID-19. 6. Cover your cough and sneezes with a tissue or use the inside of your elbow. 7. Wash your hands often with soap and water for at least 20 seconds or clean your hands with an alcohol-based hand sanitizer that contains at least 60% alcohol. 8. As much as possible, stay in a specific room and away from other people in your home. Also, you should use a separate bathroom, if available. If you need to be around other people in or outside of the home, wear a mask. 9. Avoid sharing personal items with other people in your household, like dishes, towels, and bedding. Clean all surfaces that are touched often, like counters, tabletops, and doorknobs. Use household cleaning sprays or wipes according to the label instructions. What types of side effects do monoclonal antibody drugs cause?  Common side effects  In general, the more common side effects caused by monoclonal antibody drugs include: Allergic reactions, such as hives or itching Flu-like signs and symptoms, including chills, fatigue, fever, and muscle aches and pains Nausea, vomiting Diarrhea Skin rashes Low blood pressure   The CDC is recommending patients who receive monoclonal antibody treatments wait at least 90 days before being vaccinated.  10. Currently, there are no data on the safety and efficacy  of mRNA COVID-19 vaccines in persons who received monoclonal antibodies or convalescent plasma as part of COVID-19 treatment. Based on the estimated half-life of such therapies as well as evidence suggesting that reinfection is uncommon in the 90 days after initial infection, vaccination should be deferred for at least 90 days, as a precautionary measure until additional information becomes available, to avoid interference of the antibody treatment with vaccine-induced immune responses.

## 2020-06-25 NOTE — Progress Notes (Signed)
  Diagnosis: COVID-19  Physician: Wright   Procedure: Covid Infusion Clinic Med: casirivimab\imdevimab infusion - Provided patient with casirivimab\imdevimab fact sheet for patients, parents and caregivers prior to infusion.  Complications: No immediate complications noted.  Discharge: Discharged home   Mathew Postiglione A 06/25/2020  

## 2020-06-29 ENCOUNTER — Other Ambulatory Visit: Payer: Self-pay | Admitting: Sleep Medicine

## 2020-06-29 ENCOUNTER — Other Ambulatory Visit: Payer: BLUE CROSS/BLUE SHIELD

## 2020-06-29 DIAGNOSIS — I471 Supraventricular tachycardia: Secondary | ICD-10-CM

## 2020-07-02 LAB — NOVEL CORONAVIRUS, NAA: SARS-CoV-2, NAA: NOT DETECTED

## 2021-05-28 ENCOUNTER — Encounter (HOSPITAL_BASED_OUTPATIENT_CLINIC_OR_DEPARTMENT_OTHER): Payer: Self-pay | Admitting: Orthopaedic Surgery

## 2021-06-04 NOTE — H&P (Signed)
PREOPERATIVE H&P  Chief Complaint: LEFT KNEE MENISCUS TEAR,ACL TEAR  HPI: Megan Bryan is a 40 y.o. female who is scheduled for, Procedure(s): KNEE ARTHROSCOPY WITH LATERAL MENISECTOMY KNEE ARTHROSCOPY WITH MENISCAL REPAIR ANTERIOR CRUCIATE LIGAMENT (ACL) REPAIR.   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 She had a left ACL tear while playing basketball with the children at school. She is the mother of five children. The patient desires a return to sport and is an active person.  Her symptoms are rated as moderate to severe, and have been worsening.  This is significantly impairing activities of daily living.    Please see clinic note for further details on this patient's care.    She has elected for surgical management.   Past Medical History:  Diagnosis Date   BMI 27.0-27.9,adult    Past Surgical History:  Procedure Laterality Date   TUBAL LIGATION     Social History   Socioeconomic History   Marital status: Married    Spouse name: Not on file   Number of children: Not on file   Years of education: Not on file   Highest education level: Not on file  Occupational History   Not on file  Tobacco Use   Smoking status: Never   Smokeless tobacco: Never  Vaping Use   Vaping Use: Never used  Substance and Sexual Activity   Alcohol use: No   Drug use: Never   Sexual activity: Not on file  Other Topics Concern   Not on file  Social History Narrative   Not on file   Social Determinants of Health   Financial Resource Strain: Not on file  Food Insecurity: Not on file  Transportation Needs: Not on file  Physical Activity: Not on file  Stress: Not on file  Social Connections: Not on file   History reviewed. No pertinent family history. No Known Allergies Prior to Admission medications   Medication Sig Start Date End Date Taking? Authorizing Provider  oseltamivir (TAMIFLU) 75 MG capsule Take 1 capsule (75 mg total)  by mouth 2 (two) times daily. 01/22/16   Deatra Canter, FNP    ROS: All other systems have been reviewed and were otherwise negative with the exception of those mentioned in the HPI and as above.  Physical Exam: General: Alert, no acute distress Cardiovascular: No pedal edema Respiratory: No cyanosis, no use of accessory musculature GI: No organomegaly, abdomen is soft and non-tender Skin: No lesions in the area of chief complaint Neurologic: Sensation intact distally Psychiatric: Patient is competent for consent with normal mood and affect Lymphatic: No axillary or cervical lymphadenopathy  MUSCULOSKELETAL:  On examination there is a positive Lachman. Increased translation.  Well perfused extremity otherwise.   Imaging: MRI demonstrates a bone bruise and left ACL tear.  Assessment: LEFT KNEE MENISCUS TEAR,ACL TEAR  Plan: Plan for Procedure(s): KNEE ARTHROSCOPY WITH LATERAL MENISECTOMY KNEE ARTHROSCOPY WITH MENISCAL REPAIR ANTERIOR CRUCIATE LIGAMENT (ACL) REPAIR  The risks benefits and alternatives were discussed with the patient including but not limited to the risks of nonoperative treatment, versus surgical intervention including infection, bleeding, nerve injury,  blood clots, cardiopulmonary complications, morbidity, mortality, among others, and they were willing to proceed.   The patient acknowledged the explanation, agreed to proceed with the plan and consent was signed.   Operative Plan: Left knee arthroscopy with ACL reconstruction with BTB and meniscus repair vs meniscectomy Discharge Medications: Standard DVT Prophylaxis: Aspirin Physical  Therapy: Outpatient PT Special Discharge needs: Knee immobilizer. IceMan   Vernetta Honey, PA-C  06/04/2021 4:51 PM

## 2021-06-05 ENCOUNTER — Encounter (HOSPITAL_BASED_OUTPATIENT_CLINIC_OR_DEPARTMENT_OTHER)
Admission: RE | Disposition: A | Discharge status: Home / Self Care | Payer: Self-pay | Source: Home / Self Care | Attending: Orthopaedic Surgery

## 2021-06-05 ENCOUNTER — Other Ambulatory Visit: Payer: Self-pay

## 2021-06-05 ENCOUNTER — Ambulatory Visit (HOSPITAL_BASED_OUTPATIENT_CLINIC_OR_DEPARTMENT_OTHER): Payer: 59 | Admitting: Certified Registered Nurse Anesthetist

## 2021-06-05 ENCOUNTER — Ambulatory Visit (HOSPITAL_BASED_OUTPATIENT_CLINIC_OR_DEPARTMENT_OTHER)
Admission: RE | Admit: 2021-06-05 | Discharge: 2021-06-05 | Disposition: A | Discharge status: Home / Self Care | Payer: 59 | Attending: Orthopaedic Surgery | Admitting: Orthopaedic Surgery

## 2021-06-05 ENCOUNTER — Encounter (HOSPITAL_BASED_OUTPATIENT_CLINIC_OR_DEPARTMENT_OTHER): Payer: Self-pay | Admitting: Orthopaedic Surgery

## 2021-06-05 DIAGNOSIS — S83512A Sprain of anterior cruciate ligament of left knee, initial encounter: Secondary | ICD-10-CM | POA: Diagnosis not present

## 2021-06-05 DIAGNOSIS — Y990 Civilian activity done for income or pay: Secondary | ICD-10-CM | POA: Insufficient documentation

## 2021-06-05 DIAGNOSIS — Y9367 Activity, basketball: Secondary | ICD-10-CM | POA: Insufficient documentation

## 2021-06-05 DIAGNOSIS — S83282A Other tear of lateral meniscus, current injury, left knee, initial encounter: Secondary | ICD-10-CM | POA: Diagnosis present

## 2021-06-05 DIAGNOSIS — Y92219 Unspecified school as the place of occurrence of the external cause: Secondary | ICD-10-CM | POA: Insufficient documentation

## 2021-06-05 HISTORY — PX: KNEE ARTHROSCOPY WITH MEDIAL MENISECTOMY: SHX5651

## 2021-06-05 HISTORY — PX: ANTERIOR CRUCIATE LIGAMENT REPAIR: SHX115

## 2021-06-05 HISTORY — PX: KNEE ARTHROSCOPY WITH LATERAL MENISECTOMY: SHX6193

## 2021-06-05 LAB — POCT PREGNANCY, URINE: Preg Test, Ur: NEGATIVE

## 2021-06-05 SURGERY — ARTHROSCOPY, KNEE, WITH LATERAL MENISCECTOMY
Anesthesia: General | Site: Knee | Laterality: Left

## 2021-06-05 MED ORDER — ASPIRIN 81 MG PO CHEW: 81.0000 mg | CHEWABLE_TABLET | Freq: Two times a day (BID) | ORAL | 0 refills | Status: AC

## 2021-06-05 MED ORDER — ONDANSETRON HCL 4 MG/2ML IJ SOLN
INTRAMUSCULAR | Status: DC | PRN
  Administered 2021-06-05: 4 mg via INTRAVENOUS

## 2021-06-05 MED ORDER — PROMETHAZINE HCL 25 MG/ML IJ SOLN: 6.2500 mg | INTRAMUSCULAR | Status: DC | PRN

## 2021-06-05 MED ORDER — ONDANSETRON HCL 4 MG PO TABS: 4.0000 mg | ORAL_TABLET | Freq: Three times a day (TID) | ORAL | 0 refills | Status: AC | PRN

## 2021-06-05 MED ORDER — SODIUM CHLORIDE 0.9 % IR SOLN
Status: DC | PRN
  Administered 2021-06-05: 6000 mL

## 2021-06-05 MED ORDER — DEXAMETHASONE SODIUM PHOSPHATE 10 MG/ML IJ SOLN
INTRAMUSCULAR | Status: AC
  Filled 2021-06-05: qty 1

## 2021-06-05 MED ORDER — DEXAMETHASONE SODIUM PHOSPHATE 4 MG/ML IJ SOLN
INTRAMUSCULAR | Status: DC | PRN
  Administered 2021-06-05 (×2): 5 mg via INTRAVENOUS

## 2021-06-05 MED ORDER — BUPIVACAINE-EPINEPHRINE (PF) 0.5% -1:200000 IJ SOLN
INTRAMUSCULAR | Status: DC | PRN
  Administered 2021-06-05: 30 mL via PERINEURAL

## 2021-06-05 MED ORDER — CEFAZOLIN SODIUM-DEXTROSE 2-4 GM/100ML-% IV SOLN
INTRAVENOUS | Status: AC
  Filled 2021-06-05: qty 100

## 2021-06-05 MED ORDER — ONDANSETRON HCL 4 MG/2ML IJ SOLN
INTRAMUSCULAR | Status: AC
  Filled 2021-06-05: qty 2

## 2021-06-05 MED ORDER — FENTANYL CITRATE (PF) 100 MCG/2ML IJ SOLN
INTRAMUSCULAR | Status: AC
  Filled 2021-06-05: qty 2

## 2021-06-05 MED ORDER — FENTANYL CITRATE (PF) 100 MCG/2ML IJ SOLN
50.0000 ug | Freq: Once | INTRAMUSCULAR | Status: AC
  Administered 2021-06-05: 50 ug via INTRAVENOUS

## 2021-06-05 MED ORDER — ACETAMINOPHEN 500 MG PO TABS: 1000.0000 mg | ORAL_TABLET | Freq: Three times a day (TID) | ORAL | 0 refills | Status: AC

## 2021-06-05 MED ORDER — PROPOFOL 10 MG/ML IV BOLUS
INTRAVENOUS | Status: DC | PRN
  Administered 2021-06-05: 150 mg via INTRAVENOUS

## 2021-06-05 MED ORDER — OXYCODONE HCL 5 MG PO TABS: ORAL_TABLET | ORAL | 0 refills | Status: AC

## 2021-06-05 MED ORDER — FENTANYL CITRATE (PF) 100 MCG/2ML IJ SOLN
INTRAMUSCULAR | Status: DC | PRN
  Administered 2021-06-05 (×2): 50 ug via INTRAVENOUS

## 2021-06-05 MED ORDER — VANCOMYCIN HCL 1000 MG IV SOLR
INTRAVENOUS | Status: DC | PRN
  Administered 2021-06-05: 1000 mg

## 2021-06-05 MED ORDER — LIDOCAINE HCL (CARDIAC) PF 100 MG/5ML IV SOSY
PREFILLED_SYRINGE | INTRAVENOUS | Status: DC | PRN
  Administered 2021-06-05: 60 mg via INTRAVENOUS

## 2021-06-05 MED ORDER — PROPOFOL 10 MG/ML IV BOLUS
INTRAVENOUS | Status: AC
  Filled 2021-06-05: qty 20

## 2021-06-05 MED ORDER — VANCOMYCIN HCL 1000 MG IV SOLR
INTRAVENOUS | Status: AC
  Filled 2021-06-05: qty 40

## 2021-06-05 MED ORDER — GLYCOPYRROLATE 0.2 MG/ML IJ SOLN
INTRAMUSCULAR | Status: DC | PRN
  Administered 2021-06-05: .2 mg via INTRAVENOUS

## 2021-06-05 MED ORDER — CEFAZOLIN SODIUM-DEXTROSE 2-4 GM/100ML-% IV SOLN
2.0000 g | INTRAVENOUS | Status: AC
  Administered 2021-06-05: 2 g via INTRAVENOUS

## 2021-06-05 MED ORDER — LACTATED RINGERS IV SOLN: INTRAVENOUS | Status: DC

## 2021-06-05 MED ORDER — MIDAZOLAM HCL 2 MG/2ML IJ SOLN
INTRAMUSCULAR | Status: AC
  Filled 2021-06-05: qty 2

## 2021-06-05 MED ORDER — MIDAZOLAM HCL 5 MG/5ML IJ SOLN
INTRAMUSCULAR | Status: DC | PRN
  Administered 2021-06-05 (×2): 1 mg via INTRAVENOUS

## 2021-06-05 MED ORDER — METHOCARBAMOL 500 MG PO TABS: 500.0000 mg | ORAL_TABLET | Freq: Three times a day (TID) | ORAL | 0 refills | Status: AC | PRN

## 2021-06-05 MED ORDER — ACETAMINOPHEN 500 MG PO TABS
ORAL_TABLET | ORAL | Status: AC
  Filled 2021-06-05: qty 2

## 2021-06-05 MED ORDER — GLYCOPYRROLATE PF 0.2 MG/ML IJ SOSY
PREFILLED_SYRINGE | INTRAMUSCULAR | Status: AC
  Filled 2021-06-05: qty 1

## 2021-06-05 MED ORDER — EPHEDRINE SULFATE 50 MG/ML IJ SOLN
INTRAMUSCULAR | Status: DC | PRN
  Administered 2021-06-05: 10 mg via INTRAVENOUS

## 2021-06-05 MED ORDER — LIDOCAINE HCL (PF) 2 % IJ SOLN
INTRAMUSCULAR | Status: AC
  Filled 2021-06-05: qty 5

## 2021-06-05 MED ORDER — MIDAZOLAM HCL 2 MG/2ML IJ SOLN
2.0000 mg | Freq: Once | INTRAMUSCULAR | Status: AC
  Administered 2021-06-05: 2 mg via INTRAVENOUS

## 2021-06-05 MED ORDER — FENTANYL CITRATE (PF) 100 MCG/2ML IJ SOLN: 25.0000 ug | INTRAMUSCULAR | Status: DC | PRN

## 2021-06-05 MED ORDER — CELECOXIB 100 MG PO CAPS: 100.0000 mg | ORAL_CAPSULE | Freq: Two times a day (BID) | ORAL | 0 refills | Status: AC

## 2021-06-05 MED ORDER — ACETAMINOPHEN 500 MG PO TABS
1000.0000 mg | ORAL_TABLET | Freq: Once | ORAL | Status: AC
  Administered 2021-06-05: 1000 mg via ORAL

## 2021-06-05 MED ORDER — CLONIDINE HCL (ANALGESIA) 100 MCG/ML EP SOLN
EPIDURAL | Status: DC | PRN
  Administered 2021-06-05: 100 ug

## 2021-06-05 SURGICAL SUPPLY — 84 items
ANCH SUT SWLK 19.1X4.75 VT (Anchor) ×1 IMPLANT
ANCHOR PEEK 4.75X19.1 SWLK C (Anchor) ×2 IMPLANT
APL PRP STRL LF DISP 70% ISPRP (MISCELLANEOUS) ×1
BANDAGE ESMARK 6X9 LF (GAUZE/BANDAGES/DRESSINGS) IMPLANT
BLADE AVERAGE 25MMX9MM (BLADE) ×1
BLADE AVERAGE 25X9 (BLADE) ×2 IMPLANT
BLADE CLIPPER SURG (BLADE) IMPLANT
BLADE SHAVER BONE 5.0MM X 13CM (MISCELLANEOUS) ×1
BLADE SHAVER BONE 5.0X13 (MISCELLANEOUS) ×2 IMPLANT
BLADE SURG 10 STRL SS (BLADE) ×3 IMPLANT
BLADE SURG 15 STRL LF DISP TIS (BLADE) ×1 IMPLANT
BLADE SURG 15 STRL SS (BLADE) ×3
BNDG CMPR 9X6 STRL LF SNTH (GAUZE/BANDAGES/DRESSINGS)
BNDG ELASTIC 6X5.8 VLCR STR LF (GAUZE/BANDAGES/DRESSINGS) ×2 IMPLANT
BNDG ESMARK 6X9 LF (GAUZE/BANDAGES/DRESSINGS)
BONE TUNNEL PLUG CANNULATED (MISCELLANEOUS) ×1 IMPLANT
BURR OVAL 8 FLU 4.0MM X 13CM (MISCELLANEOUS) ×1
BURR OVAL 8 FLU 4.0X13 (MISCELLANEOUS) ×1 IMPLANT
CHLORAPREP W/TINT 26 (MISCELLANEOUS) ×3 IMPLANT
CLOSURE STERI-STRIP 1/2X4 (GAUZE/BANDAGES/DRESSINGS) ×1
CLSR STERI-STRIP ANTIMIC 1/2X4 (GAUZE/BANDAGES/DRESSINGS) ×2 IMPLANT
COLLECTOR GRAFT TISSUE (SYSTAGENIX WOUND MANAGEMENT) ×3
COOLER ICEMAN CLASSIC (MISCELLANEOUS) ×3 IMPLANT
COVER BACK TABLE 60X90IN (DRAPES) ×3 IMPLANT
CUFF TOURN SGL QUICK 34 (TOURNIQUET CUFF) ×3
CUFF TRNQT CYL 34X4.125X (TOURNIQUET CUFF) IMPLANT
CUTTER TENSIONER SUT 2-0 0 FBW (INSTRUMENTS) IMPLANT
DECANTER SPIKE VIAL GLASS SM (MISCELLANEOUS) IMPLANT
DISSECTOR 3.5MM X 13CM CVD (MISCELLANEOUS) IMPLANT
DISSECTOR 4.0MMX13CM CVD (MISCELLANEOUS) ×2 IMPLANT
DRAPE ARTHROSCOPY W/POUCH 90 (DRAPES) ×3 IMPLANT
DRAPE IMP U-DRAPE 54X76 (DRAPES) ×3 IMPLANT
DRAPE TOP ARMCOVERS (MISCELLANEOUS) ×3 IMPLANT
DRAPE U-SHAPE 47X51 STRL (DRAPES) ×3 IMPLANT
ELECT REM PT RETURN 9FT ADLT (ELECTROSURGICAL) ×3
ELECTRODE REM PT RTRN 9FT ADLT (ELECTROSURGICAL) ×1 IMPLANT
FIBERSTICK 2 (SUTURE) IMPLANT
GAUZE SPONGE 4X4 12PLY STRL (GAUZE/BANDAGES/DRESSINGS) ×6 IMPLANT
GLOVE SRG 8 PF TXTR STRL LF DI (GLOVE) ×1 IMPLANT
GLOVE SURG ENC MOIS LTX SZ6.5 (GLOVE) ×5 IMPLANT
GLOVE SURG LTX SZ8 (GLOVE) ×6 IMPLANT
GLOVE SURG UNDER POLY LF SZ6.5 (GLOVE) ×5 IMPLANT
GLOVE SURG UNDER POLY LF SZ8 (GLOVE) ×6
GOWN STRL REUS W/ TWL LRG LVL3 (GOWN DISPOSABLE) ×2 IMPLANT
GOWN STRL REUS W/TWL LRG LVL3 (GOWN DISPOSABLE) ×6
GOWN STRL REUS W/TWL XL LVL3 (GOWN DISPOSABLE) ×5 IMPLANT
GUIDEPIN FLEX PATHFINDER 2.4MM (WIRE) ×1 IMPLANT
IMMOBILIZER KNEE 22 UNIV (SOFTGOODS) IMPLANT
IMMOBILIZER KNEE 24 THIGH 36 (MISCELLANEOUS) IMPLANT
IMMOBILIZER KNEE 24 UNIV (MISCELLANEOUS)
IV NS IRRIG 3000ML ARTHROMATIC (IV SOLUTION) ×12 IMPLANT
KIT TRANSTIBIAL (DISPOSABLE) ×3 IMPLANT
KIT TURNOVER KIT B (KITS) ×3 IMPLANT
KNIFE GRAFT ACL 10MM 5952 (MISCELLANEOUS) ×3 IMPLANT
KNIFE GRAFT ACL 9MM (MISCELLANEOUS) IMPLANT
MANIFOLD NEPTUNE II (INSTRUMENTS) ×3 IMPLANT
NDL SAFETY ECLIPSE 18X1.5 (NEEDLE) ×1 IMPLANT
NEEDLE HYPO 18GX1.5 SHARP (NEEDLE)
NS IRRIG 1000ML POUR BTL (IV SOLUTION) ×3 IMPLANT
PACK ARTHROSCOPY DSU (CUSTOM PROCEDURE TRAY) ×3 IMPLANT
PACK BASIN DAY SURGERY FS (CUSTOM PROCEDURE TRAY) ×3 IMPLANT
PAD COLD SHLDR WRAP-ON (PAD) ×3 IMPLANT
PADDING CAST COTTON 6X4 STRL (CAST SUPPLIES) IMPLANT
PENCIL SMOKE EVACUATOR (MISCELLANEOUS) IMPLANT
PORT APPOLLO RF 90DEGREE MULTI (SURGICAL WAND) ×2 IMPLANT
SCREW SHEATHED INTERF 7X25 (Screw) ×2 IMPLANT
SCREW SHEATHED INTERF 9X20MM (Screw) ×2 IMPLANT
SLEEVE SCD COMPRESS KNEE MED (STOCKING) ×3 IMPLANT
SPONGE T-LAP 4X18 ~~LOC~~+RFID (SPONGE) ×3 IMPLANT
SUT FIBERWIRE #2 38 T-5 BLUE (SUTURE) ×18
SUT MNCRL AB 4-0 PS2 18 (SUTURE) ×3 IMPLANT
SUT VIC AB 0 CT1 27 (SUTURE) ×3
SUT VIC AB 0 CT1 27XBRD ANBCTR (SUTURE) ×1 IMPLANT
SUT VIC AB 3-0 SH 27 (SUTURE) ×6
SUT VIC AB 3-0 SH 27X BRD (SUTURE) ×1 IMPLANT
SUTURE FIBERWR #2 38 T-5 BLUE (SUTURE) IMPLANT
SYR 5ML LL (SYRINGE) ×1 IMPLANT
TISSUE GRAFT COLLECTOR (SYSTAGENIX WOUND MANAGEMENT) IMPLANT
TOWEL GREEN STERILE FF (TOWEL DISPOSABLE) ×6 IMPLANT
TUBE CONNECTING 20'X1/4 (TUBING) ×2
TUBE CONNECTING 20X1/4 (TUBING) ×3 IMPLANT
TUBE SUCTION HIGH CAP CLEAR NV (SUCTIONS) ×3 IMPLANT
TUBING ARTHROSCOPY IRRIG 16FT (MISCELLANEOUS) ×3 IMPLANT
WRAP KNEE MAXI GEL POST OP (GAUZE/BANDAGES/DRESSINGS) ×1 IMPLANT

## 2021-06-05 NOTE — Progress Notes (Signed)
AssistedDr. Turk with left, ultrasound guided, adductor canal block. Side rails up, monitors on throughout procedure. See vital signs in flow sheet. Tolerated Procedure well.  

## 2021-06-05 NOTE — Anesthesia Postprocedure Evaluation (Signed)
Anesthesia Post Note  Patient: Megan Bryan  Procedure(s) Performed: KNEE ARTHROSCOPY WITH LATERAL MENISECTOMY (Left: Knee) KNEE ARTHROSCOPY WITH MEDIAL MENISECTOMY (Left: Knee) ANTERIOR CRUCIATE LIGAMENT (ACL) REPAIR with patellar tendon graft (Left: Knee)     Patient location during evaluation: PACU Anesthesia Type: General Level of consciousness: awake and alert, awake and oriented Pain management: pain level controlled Vital Signs Assessment: post-procedure vital signs reviewed and stable Respiratory status: spontaneous breathing, nonlabored ventilation and respiratory function stable Cardiovascular status: blood pressure returned to baseline and stable Postop Assessment: no apparent nausea or vomiting Anesthetic complications: no   No notable events documented.  Last Vitals:  Vitals:   06/05/21 1045 06/05/21 1100  BP: (!) 143/78 119/74  Pulse: 80 68  Resp: 18 16  Temp:    SpO2: 100% 96%    Last Pain:  Vitals:   06/05/21 1045  TempSrc:   PainSc: 0-No pain                 Cecile Hearing

## 2021-06-05 NOTE — Anesthesia Procedure Notes (Signed)
Procedure Name: LMA Insertion Date/Time: 06/05/2021 8:39 AM Performed by: Cleda Clarks, CRNA Pre-anesthesia Checklist: Patient identified, Emergency Drugs available, Suction available and Patient being monitored Patient Re-evaluated:Patient Re-evaluated prior to induction Oxygen Delivery Method: Circle system utilized Preoxygenation: Pre-oxygenation with 100% oxygen Induction Type: IV induction Ventilation: Mask ventilation without difficulty LMA: LMA inserted LMA Size: 3.0 Number of attempts: 1 Placement Confirmation: positive ETCO2 Tube secured with: Tape Dental Injury: Teeth and Oropharynx as per pre-operative assessment

## 2021-06-05 NOTE — Discharge Instructions (Addendum)
Ramond Marrow MD, MPH Alfonse Alpers, PA-C [facility] [address] [phone] (tel)   [phone] (fax)   POST-OPERATIVE INSTRUCTIONS - ACL RECONSTRUCTION  WOUND CARE You may remove the Operative Dressing on Post-Op Day #3 (72hrs after surgery).   Leave steri strips in place.   If you feel more comfortable with it you can leave all dressings in place till your 1 week follow-up appointment.   KEEP THE INCISIONS CLEAN AND DRY. An ACE wrap may be used to control swelling, do not wrap this too tight.  If the initial ACE wrap feels too tight or constricting you may loosen it. There may be a small amount of fluid/bleeding leaking at the surgical site.  This is normal; the knee is filled with fluid during the procedure and can leak for 24-48hrs after surgery.  You may change/reinforce the bandage as needed.  Use the Cryocuff, GameReady or Ice as often as possible for the first 3-4 days, then as needed for pain relief. Always keep a towel, ACE wrap or other barrier between the cooling unit and your skin.  You may shower on Post-Op Day #3. Gently pat the area dry. Do not soak the knee in water.  Do not go swimming in the pool or ocean until 4 weeks after surgery or when otherwise instructed.  BRACE/AMBULATION Your leg will be placed in a brace post-operatively.  You will need to wear your brace at all times You can remove your brace for hygiene only It should be locked in full extension (0 degrees) if adjustable.   You will be instructed on further bracing after your first visit. Use crutches for comfort but you can put your full weight on the leg as tolerated.  REGIONAL ANESTHESIA (NERVE BLOCKS) - The anesthesia team may have performed a nerve block for you if safe in the setting of your care.  This is a great tool used to minimize pain.  Typically the block may start wearing off overnight.  This can be a challenging period but please utilize your as needed  pain medications to try and manage this period and know it will be a brief transition as the nerve block wears completely   POST-OP MEDICATIONS- Multimodal approach to pain control In general your pain will be controlled with a combination of substances.  Prescriptions unless otherwise discussed are electronically sent to your pharmacy.  This is a carefully made plan we use to minimize narcotic use.     Meloxicam OR Celebrex - Anti-inflammatory medication taken on a scheduled basis Acetaminophen - Non-narcotic pain medicine taken on a scheduled basis  Robaxin - this is a muscle relaxer, take as needed for muscle spasms Oxycodone - This is a strong narcotic, to be used only on an "as needed" basis for SEVERE pain. Aspirin 81mg  - This medicine is used to minimize the risk of blood clots after surgery. Zofran - take as needed for nausea   FOLLOW-UP Please call the office to schedule a follow-up appointment for your incision check if you do not already have one, 7-10 days post-operatively. IF YOU HAVE ANY QUESTIONS, PLEASE FEEL FREE TO CALL OUR OFFICE.  HELPFUL INFORMATION  If you had a block, it will wear off between 8-24 hrs postop typically.  This is period when your pain may go from nearly zero to the pain you would have had post-op without the block.  This is an abrupt transition but nothing dangerous is happening.  You may take  an extra dose of narcotic when this happens.  Keep your leg elevated to decrease swelling, which will then in turn decrease your pain. I would elevate the foot of your bed by putting a couple of couch pillows between your mattress and box spring. I would not keep pillow directly under your ankle.  You must wear the brace locked while sleeping and ambulating until follow-up.   There will be MORE swelling on days 1-3 than there is on the day of surgery.  This also is normal. The swelling will decrease with the anti-inflammatory medication, ice and keeping it elevated.  The swelling will make it more difficult to bend your knee. As the swelling goes down your motion will become easier  You may develop swelling and bruising that extends from your knee down to your calf and perhaps even to your foot over the next week. Do not be alarmed. This too is normal, and it is due to gravity  There may be some numbness adjacent to the incision site. This may last for 6-12 months or longer in some patients and is expected.  You may return to sedentary work/school in the next couple of days when you feel up to it. You will need to keep your leg elevated as much as possible   You should wean off your narcotic medicines as soon as you are able.  Most patients will be off or using minimal narcotics before their first postop appointment.   We suggest you use the pain medication the first night prior to going to bed, in order to ease any pain when the anesthesia wears off. You should avoid taking pain medications on an empty stomach as it will make you nauseous.  Do not drink alcoholic beverages or take illicit drugs when taking pain medications.  It is against the law to drive while taking narcotics. You cannot drive if your Right leg is in brace locked in extension.  Pain medication may make you constipated.  Below are a few solutions to try in this order: Decrease the amount of pain medication if you aren't having pain. Drink lots of decaffeinated fluids. Drink prune juice and/or each dried prunes  If the first 3 don't work start with additional solutions Take Colace - an over-the-counter stool softener Take Senokot - an over-the-counter laxative Take Miralax - a stronger over-the-counter laxative  For more information including helpful videos and documents visit our website:   [email or web address]       Post Anesthesia Home Care Instructions  Activity: Get plenty of rest for the remainder of the day. A responsible individual  must stay with you for 24 hours following the procedure.  For the next 24 hours, DO NOT: -Drive a car -Advertising copywriter -Drink alcoholic beverages -Take any medication unless instructed by your physician -Make any legal decisions or sign important papers.  Meals: Start with liquid foods such as gelatin or soup. Progress to regular foods as tolerated. Avoid greasy, spicy, heavy foods. If nausea and/or vomiting occur, drink only clear liquids until the nausea and/or vomiting subsides. Call your physician if vomiting continues.  Special Instructions/Symptoms: Your throat may feel dry or sore from the anesthesia or the breathing tube placed in your throat during surgery. If this causes discomfort, gargle with warm salt water. The discomfort should disappear within 24 hours.  If you had a scopolamine patch placed behind your ear for the management of post- operative nausea and/or vomiting:  1. The medication in the patch is effective  for 72 hours, after which it should be removed.  Wrap patch in a tissue and discard in the trash. Wash hands thoroughly with soap and water. 2. You may remove the patch earlier than 72 hours if you experience unpleasant side effects which may include dry mouth, dizziness or visual disturbances. 3. Avoid touching the patch. Wash your hands with soap and water after contact with the patch.        Regional Anesthesia Blocks  1. Numbness or the inability to move the "blocked" extremity may last from 3-48 hours after placement. The length of time depends on the medication injected and your individual response to the medication. If the numbness is not going away after 48 hours, call your surgeon.  2. The extremity that is blocked will need to be protected until the numbness is gone and the  Strength has returned. Because you cannot feel it, you will need to take extra care to avoid injury. Because it may be weak, you may have difficulty moving it or using it. You may  not know what position it is in without looking at it while the block is in effect.  3. For blocks in the legs and feet, returning to weight bearing and walking needs to be done carefully. You will need to wait until the numbness is entirely gone and the strength has returned. You should be able to move your leg and foot normally before you try and bear weight or walk. You will need someone to be with you when you first try to ensure you do not fall and possibly risk injury.  4. Bruising and tenderness at the needle site are common side effects and will resolve in a few days.  5. Persistent numbness or new problems with movement should be communicated to the surgeon or the [facility] [phone] [facility] [phone]).     Next dose of Tylenol can be given after 1:40 PM.

## 2021-06-05 NOTE — Op Note (Signed)
Orthopaedic Surgery Operative Note (CSN: [id number])  Megan Bryan  07/05/1981 Date of Surgery: 06/05/2021   Diagnoses:  Left traumatic ACL tear, lateral meniscus tear  Procedure: Left knee arthroscopically assisted ACL reconstruction with BTB autograft Left lateral partial meniscectomy   Operative Finding Exam under anesthesia: Full motion, 5 to 8 degrees of hyperextension, full flexion, Lachman was grossly unstable Suprapatellar pouch: Normal Patellofemoral Compartment: Normal Medial Compartment: Normal Lateral Compartment: Complex posterior lateral meniscus tear, 30% total meniscal volume resected.  Some areas in the central portion of the tibial plateau grade 1 and 2 changes Intercondylar Notch: Complete ACL tear  Successful completion of the planned procedure.  Patient's bone quality is relatively poor in the tibia for her age and activity level.  We did backup our fixation but we had a robust 10 mm graft with good purchase of the femoral screw and reasonable purchase of the tibia with backup swivel lock fixation.  Post-operative plan: The patient will be weightbearing to tolerance in a hinged brace locked in extension.  The patient will be discharged home.  DVT prophylaxis Aspirin 81 mg twice daily for 6 weeks.  Pain control with PRN pain medication preferring oral medicines.  Follow up plan will be scheduled in approximately 7 days for incision check and XR.  Post-Op Diagnosis: Same Surgeons:Primary: Bjorn Pippin, MD Assistants:Caroline McBane PA-C Location: [facility] OR ROOM 6 Anesthesia: General with adductor canal Antibiotics: Ancef 2 g with local vancomycin powder 1 g at the surgical site Tourniquet time: 60 Estimated Blood Loss: Minimal Complications: None Specimens: None Implants: Implant Name Type Inv. Item Serial No. Manufacturer Lot No. LRB No. Used Action  SCREW SHEATHED INTERF 7X25 - [id number] Screw SCREW SHEATHED INTERF 7X25  [employer] [id number] Left 1  Implanted  SCREW SHEATHED INTERF 9X20MM - [id number] Screw SCREW SHEATHED INTERF 9X20MM  [employer] [id number] Left 1 Implanted  ANCHOR PEEK 4.75X19.1 SWLK C - [id number] Anchor ANCHOR PEEK 4.75X19.1 SWLK C  [employer] [id number] Left 1 Implanted    Indications for Surgery:   Megan Bryan is a 40 y.o. female with sports related injury resulting in an ACL tear and lateral meniscus tear.  We talked about the ability to return to high levels of activity as well as protection provided by the ACL and she like to proceed with reconstruction.  Benefits and risks of operative and nonoperative management were discussed prior to surgery with patient/guardian(s) and informed consent form was completed.  Specific risks including infection, need for additional surgery, rerupture, stiffness, anterior knee pain.   Procedure:   The patient was identified properly. Informed consent was obtained and the surgical site was marked. The patient was taken up to suite where general anesthesia was induced. The patient was placed in the supine position with a post against the surgical leg and a nonsterile tourniquet applied. The surgical leg was then prepped and draped usual sterile fashion.  A standard surgical timeout was performed.  2 standard anterior portals were made and diagnostic arthroscopy performed. Please note the findings as noted above.  We began by making an incision along the medial third of the patellar tendon in line with the tendon itself starting at the level of the distal pole of the patella ending 3 cm distal to the insertion on the tubercle. We carried the incision down sharply achieving hemostasis 3 progressed identifying the tissue plane of the peritenon. The created skin flaps medially and laterally taking care to avoid damage to the superficial skin. This  point the peritenon was incised sharply in line with the tendon and again flaps are created exposing the medial and lateral borders of the tendon.  We then took care to ensure that there is appropriate visualization for forearm harvest within our incision using a mobile window technique.  We then used a double bladed scalpel incised the tendon longitudinally 10 mm wide. This incision within the tendon was carried proximal and distally onto the tubercle and proximal wound patella to create a 25 mm bone block from patella and a 27 mm bone block from the tibia.  We made our longitudinal cuts with a saw taking care to not go deeper than 10 mm and rather than make a transverse patella cut we made a bullet style tapered cut at the proximal aspect of the patella to avoid the risk for transverse patella fracture.  The harvest went without issue and graft was taken to the back table.    This point we closed the defect in the patellar tendon after identifying that there was appropriate medial and lateral tendon still intact.   We began arthroscopy and made our lateral and medial portals within our incision on each side of the tendon. Fat pad was resected and diagnostic arthroscopy performed with the findings listed above.   We performed a partial lateral meniscectomy with a shaver and a basket back to stable base.  All meniscal remnants were removed.  The anterior cruciate ligament stump was debrided utilizing a shaver taking great care to preserve the remnant stump on the femur and the tibia for localization of our tunnels. Once the remnant anterior cruciate ligament was removed and we obtained appropriate visualization by performing a small notchplasty and confirmed that we had indeed identify the over-the-top position. We made small marks at the location of the aperture of the tibial and femoral tunnels and double checked our location prior to drilling.  We began with a femoral tunnel.  We had taken care to make a far medial and low anteromedial portal with spinal needle localization to ensure that we can get appropriate position on the lateral wall of the  notch from an anterior medial portal drilling technique.  We used a 7 mm offset guide with the knee at 90 degrees of flexion to mark in the position of the old ACL stump.  We then switched our camera to the medial portal and checked that our position was appropriate compared to the back wall.  At that point we hyperflexed the knee and used the 7 mm offset guide again primarily as a sheath to freehand place our wire at the aforementioned mark taking care to exit anterior to the mid femoral line to avoid posterior wall blowout.  Once the wire was advanced through the skin we clamped it and then placed a 9 mm acorn style reamer by hand ensuring that we did not interfere with the medial femoral condyle.  Once it entered the notch we are able to connect it to a reamer and ream to 35 mm of tunnel depth.  We used an Arthrex GraftNet device to harvest with a shaver any of the bony debris and cleared bony debris from the tunnel to ensure that we had an easy pass.  We again checked from the medial portal to ensure that we only had a 2 mm posterior wall and appropriate position of our tunnel at the 230 position.  At this point we advanced our Beath pin and shuttled a #2 FiberWire for eventual graft  passage.  We turned our attention to the tibial tunnel.  We cleared the old ACL stump soft tissue.  We then were able to use a Arthrex tipped aiming tibial guide set to 55 degrees to place our tibial tunnel ensuring that we were centered using the landmark of the posterior aspect of the anterior horn of the lateral meniscus as well as the previous stump.  We took great care to ensure that our wire was positioned appropriately.  We then reamed with the barrel reamer through our ACL harvest incision taking care to protect the skin.  We harvested bone as we reamed and then completed the reaming into the joint.  Any soft tissue was cleared from the apertures of the tunnel.  We finally checked our tunnel position and apertures  once more and were happy were these and proceeded with graft shuttling.  The graft was shuttled in typical fashion and we were able to visualize entering the femoral tunnel.  We ensured that the cancellous surface of the graft was anterior moving the collagen of the graft as far posterior as possible.  Before advancing the graft into the femoral socket we placed a guidewire for screw fixation.  The graft was then advanced the appropriate depth and we hyperflexed the knee for placement of our screw.  Femoral fixation was with a 7 x 25 mm metal Arthrex screw. We obtained good purchase with the screw. We verified arthroscopically that there is no sign of graft impingement on the notch. We then cycled the knee multiple times and turned our attention to the tibia.  Tibia was fixed with a 9 x 20 mm metal Arthrex screw and the graft was extremely rotated 90 to anteriorize the tendinous portion within the joint. We achieved moderate purchase of the graft and there was minimal mismatch.  We felt the backup fixation was appropriate.  We placed a 4.75 peek swivel lock in the anteromedial cortex of the tibia obtaining good purchase.  At this point a gentle Lachman maneuver was performed and there is a stable endpoint and little translation.  Autograft harvested from left over graft prep as well as reamings was used to bone graft the patella as well as the tibial defects the peritenon was closed.  Incision was closed in multilayer fashion with absorbable suture and Steri-Strips placed. Sterile dressing and knee brace were placed and patient taken to PACU without adverse event.   Alfonse Alpers, PA-C, present and scrubbed throughout the case, critical for completion in a timely fashion, and for retraction, instrumentation, closure.

## 2021-06-05 NOTE — Anesthesia Preprocedure Evaluation (Signed)
Anesthesia Evaluation  Patient identified by MRN, date of birth, ID band Patient awake    Reviewed: Allergy & Precautions, NPO status , Patient's Chart, lab work & pertinent test results  Airway Mallampati: II  TM Distance: >3 FB Neck ROM: Full    Dental  (+) Teeth Intact, Dental Advisory Given   Pulmonary neg pulmonary ROS,    Pulmonary exam normal breath sounds clear to auscultation       Cardiovascular negative cardio ROS Normal cardiovascular exam Rhythm:Regular Rate:Normal     Neuro/Psych negative neurological ROS  negative psych ROS   GI/Hepatic negative GI ROS, Neg liver ROS,   Endo/Other  Obesity   Renal/GU negative Renal ROS     Musculoskeletal negative musculoskeletal ROS (+)   Abdominal   Peds  Hematology negative hematology ROS (+)   Anesthesia Other Findings Day of surgery medications reviewed with the patient.  Reproductive/Obstetrics                             Anesthesia Physical Anesthesia Plan  ASA: 2  Anesthesia Plan: General   Post-op Pain Management:  Regional for Post-op pain   Induction: Intravenous  PONV Risk Score and Plan: 3 and Midazolam, Dexamethasone and Ondansetron  Airway Management Planned: LMA  Additional Equipment:   Intra-op Plan:   Post-operative Plan: Extubation in OR  Informed Consent: I have reviewed the patients History and Physical, chart, labs and discussed the procedure including the risks, benefits and alternatives for the proposed anesthesia with the patient or authorized representative who has indicated his/her understanding and acceptance.     Dental advisory given  Plan Discussed with: CRNA  Anesthesia Plan Comments:         Anesthesia Quick Evaluation

## 2021-06-05 NOTE — Anesthesia Procedure Notes (Addendum)
  Anesthesia Regional Block: Adductor canal block   Pre-Anesthetic Checklist: , timeout performed,  Correct Patient, Correct Site, Correct Laterality,  Correct Procedure, Correct Position, site marked,  Risks and benefits discussed,  Surgical consent,  Pre-op evaluation,  At surgeon's request and post-op pain management  Laterality: Left  Prep: chloraprep       Needles:  Injection technique: Single-shot  Needle Type: Echogenic Needle     Needle Length: 9cm  Needle Gauge: 21     Additional Needles:   Procedures:,,,, ultrasound used (permanent image in chart),,    Narrative:  Start time: 06/05/2021 7:38 AM End time: 06/05/2021 7:44 AM Injection made incrementally with aspirations every 5 mL.  Performed by: Personally  Anesthesiologist: Cecile Hearing, MD  Additional Notes: No pain on injection. No increased resistance to injection. Injection made in 5cc increments.  Good needle visualization.  Patient tolerated procedure well.

## 2021-06-05 NOTE — Interval H&P Note (Signed)
All questions answered

## 2021-06-05 NOTE — Transfer of Care (Signed)
Immediate Anesthesia Transfer of Care Note  Patient: Megan Bryan  Procedure(s) Performed: KNEE ARTHROSCOPY WITH LATERAL MENISECTOMY (Left: Knee) KNEE ARTHROSCOPY WITH MEDIAL MENISECTOMY (Left: Knee) ANTERIOR CRUCIATE LIGAMENT (ACL) REPAIR with patellar tendon graft (Left: Knee)  Patient Location: PACU  Anesthesia Type:General  Level of Consciousness: awake, alert  and oriented  Airway & Oxygen Therapy: Patient Spontanous Breathing and Patient connected to nasal cannula oxygen  Post-op Assessment: Report given to RN and Post -op Vital signs reviewed and stable  Post vital signs: Reviewed and stable  Last Vitals:  Vitals Value Taken Time  BP 127/76 06/05/21 1024  Temp 36.6 C 06/05/21 1024  Pulse 90 06/05/21 1028  Resp 13 06/05/21 1028  SpO2 100 % 06/05/21 1028  Vitals shown include unvalidated device data.  Last Pain:  Vitals:   06/05/21 1024  TempSrc:   PainSc: Asleep         Complications: No notable events documented.

## 2021-06-06 ENCOUNTER — Encounter (HOSPITAL_BASED_OUTPATIENT_CLINIC_OR_DEPARTMENT_OTHER): Payer: Self-pay | Admitting: Orthopaedic Surgery

## 2021-06-07 ENCOUNTER — Other Ambulatory Visit: Payer: Self-pay

## 2021-06-07 ENCOUNTER — Ambulatory Visit: Payer: 59 | Attending: Orthopaedic Surgery

## 2021-06-07 DIAGNOSIS — M6281 Muscle weakness (generalized): Secondary | ICD-10-CM | POA: Insufficient documentation

## 2021-06-07 DIAGNOSIS — R262 Difficulty in walking, not elsewhere classified: Secondary | ICD-10-CM | POA: Insufficient documentation

## 2021-06-07 DIAGNOSIS — M25562 Pain in left knee: Secondary | ICD-10-CM | POA: Diagnosis not present

## 2021-06-07 DIAGNOSIS — M25662 Stiffness of left knee, not elsewhere classified: Secondary | ICD-10-CM | POA: Insufficient documentation

## 2021-06-07 NOTE — Therapy (Signed)
[facility] [address] [place], [place], [address] Phone: [phone]   Fax:  [phone]  Physical Therapy Evaluation  Patient Details  Name: STACI DACK MRN: [id number] Date of Birth: February 01, 1981 Referring Provider (PT): Bjorn Pippin, MD   Encounter Date: 06/07/2021   PT End of Session - 06/07/21 0952     Visit Number 1    Number of Visits 24    Authorization Type [employer]    PT Start Time 0840    PT Stop Time 0938    PT Time Calculation (min) 58 min    Activity Tolerance Patient tolerated treatment well    Behavior During Therapy [facility] for tasks assessed/performed             Past Medical History:  Diagnosis Date   BMI 27.0-27.9,adult     Past Surgical History:  Procedure Laterality Date   ANTERIOR CRUCIATE LIGAMENT REPAIR Left 06/05/2021   Procedure: ANTERIOR CRUCIATE LIGAMENT (ACL) REPAIR with patellar tendon graft;  Surgeon: Bjorn Pippin, MD;  Location: [facility];  Service: Orthopedics;  Laterality: Left;   KNEE ARTHROSCOPY WITH LATERAL MENISECTOMY Left 06/05/2021   Procedure: KNEE ARTHROSCOPY WITH LATERAL MENISECTOMY;  Surgeon: Bjorn Pippin, MD;  Location:  SURGERY CENTER;  Service: Orthopedics;  Laterality: Left;   KNEE ARTHROSCOPY WITH MEDIAL MENISECTOMY Left 06/05/2021   Procedure: KNEE ARTHROSCOPY WITH MEDIAL MENISECTOMY;  Surgeon: Bjorn Pippin, MD;  Location:  SURGERY CENTER;  Service: Orthopedics;  Laterality: Left;   TUBAL LIGATION      There were no vitals filed for this visit.    Subjective Assessment - 06/07/21 0842     Subjective Legacy reports that she was playing basketball and someone came under her making her fall backwards.  This occurred in late June of 2022.  The patient underwent a MRI and then surgery.  They performed BTB ACL repair and lateral meniscal repair on 06/05/21.  The patient reports that she is not making the Oxycotin or muscle  relaxer as she is able to manage.  She is wearing the knee brace locked into extension.  She is sleeping with the knee brace locked into extension per the MD orders.  The patient reports that she is using the crutches and walking without weight through the left foot.  She is icing the knee frequently at home.    Limitations Standing;Walking;House hold activities    Patient Stated Goals To get back to being active, running, and playing basketball.    Currently in Pain? Yes    Pain Score 4    8/10 when coming home from the hosptial   Pain Location Knee    Pain Orientation Left    Pain Descriptors / Indicators Pressure;Pounding    Pain Type Surgical pain    Aggravating Factors  moving the knee    Pain Relieving Factors rest, ice, and medication.                [facility] PT Assessment - 06/07/21 0001       Assessment   Medical Diagnosis s/p L Knee scope lateral meniscectomy, ACL repair, Laeral meniscus repair SX 06/05/21    Referring Provider (PT) Bjorn Pippin, MD    Onset Date/Surgical Date 06/05/21    Hand Dominance Right    Next MD Visit 06/13/21    Prior Therapy none for ACL tear      Precautions   Precautions Knee    Precaution  Comments Meniscual repair / ACLR    Required Braces or Orthoses Other Brace/Splint    Other Brace/Splint hinged knee brace      Restrictions   Weight Bearing Restrictions Yes    LLE Weight Bearing Weight bearing as tolerated    Other Position/Activity Restrictions WBAT with knee brace locked into extension      Balance Screen   Has the patient fallen in the past 6 months Yes    How many times? 1    Has the patient had a decrease in activity level because of a fear of falling?  No    Is the patient reluctant to leave their home because of a fear of falling?  No      Home [employer]ist information centre manager residence    Living Arrangements Spouse/significant other;Children    Type of Home House    Home Layout Two level    Home Equipment  Crutches      Prior Function   Level of Independence Independent    Vocation Full time employment    Vocation Requirements sitting, events    Leisure [employer] work through [employer], [employer] activities      Cognition   Overall Cognitive Status Within Functional Limits for tasks assessed      Observation/Other Assessments   Focus on Therapeutic Outcomes (FOTO)  Knee 34% Predicted 69%      Circumferential Edema   Circumferential - Right 36 cm   knee joint line   Circumferential - Left  38 cm   knee joint line     ROM / Strength   AROM / PROM / Strength AROM;PROM;Strength      AROM   Right Knee Extension 10   hyperextension   Right Knee Flexion 130    Left Knee Extension 0    Left Knee Flexion 55      Strength   Overall Strength Comments Right Able to perform SLR without extensor lag; Left able to perform SLR with 5 deg extensor lag      Ambulation/Gait   Ambulation/Gait Yes    Ambulation/Gait Assistance 6: Modified independent (Device/Increase time)    Ambulation Distance (Feet) 100 Feet    Assistive device Crutches    Gait Pattern --   NWB left LE with knee brace locked into extension                       Objective measurements completed on examination: See above findings.       [facility] Adult PT Treatment/Exercise - 06/07/21 0001       Ambulation/Gait   Stairs Assistance 5: Supervision    Stairs Assistance Details (indicate cue type and reason) verbal cuing PWB LLE w/ brace locked into extension    Stair Management Technique Step to pattern;With crutches    Height of Stairs 6      Exercises   Exercises Knee/Hip      Knee/Hip Exercises: Stretches   Passive Hamstring Stretch Left;20 seconds;3 reps    Gastroc Stretch 20 seconds;3 reps;Left      Knee/Hip Exercises: Supine   Quad Sets Left;15 reps    Quad Sets Limitations 5 second hold    Heel Slides AROM;Left;15 reps    Straight Leg Raises Left;2 sets;10 reps    Straight Leg Raises  Limitations 3 second hold      Knee/Hip Exercises: Sidelying   Hip ABduction 2 sets;Left;10 reps    Hip ABduction Limitations SLR - abd  PT Education - 06/07/21 0951     Education Details POC, FOTO, HEP, stairs, S&S and prevention of blood clots    Person(s) Educated Patient    Methods Explanation;Demonstration;Handout    Comprehension Verbalized understanding;Returned demonstration              PT Short Term Goals - 06/07/21 0859       PT SHORT TERM GOAL #1   Title The patient will be independent in an initial ACLR home exercise program.    Baseline none    Time 2    Period Weeks    Status New    Target Date 06/28/21      PT SHORT TERM GOAL #2   Title The patient will present with improvement of left knee flexion active motion to 90 degrees for transfers and ambulation.    Baseline 55 deg    Time 4    Period Weeks    Status New    Target Date 07/05/21      PT SHORT TERM GOAL #3   Title The patient will be able to perform 30 reps of left SLR without an extensor lag for transition to waking without an AD.    Baseline 5 deg extensor lag    Time 4    Period Weeks    Status New    Target Date 07/05/21               PT Long Term Goals - 06/07/21 0927       PT LONG TERM GOAL #1   Title The patient will have an improved FOTO score to 69% or better for return to premorbid activities.    Baseline 34%    Time 12    Period Weeks    Status New    Target Date 08/30/21      PT LONG TERM GOAL #2   Title The patient will be able to perform an eight inch step down with no compensation and good left knee eccentric control for stairs    Baseline step to pattern w/ crutches    Time 12    Period Weeks    Status New    Target Date 08/30/21      PT LONG TERM GOAL #3   Title The patient will be able to perform a single leg stance on foam x 30 second or greater for return to community work with childern.    Baseline unable to test     Time 12    Period Weeks    Status New    Target Date 08/30/21      PT LONG TERM GOAL #4   Title The patient will be able to squat to full depth with equal weight bearing for lifting.    Baseline unable to test    Time 12    Period Weeks    Status New    Target Date 08/30/21      PT LONG TERM GOAL #5   Title The patient will be able to complete a functonal hop test or Y balance testing and score a 90% or better for return to recreational and community activities.    Baseline not tested    Time 18    Period Weeks    Status New    Target Date 10/11/21                    Plan - 06/07/21 0900     Clinical Impression  Statement Tani is a 41 y/o female who underwent left knee ACL repair with a patella tendon autograft and lateral meniscal repair.  Per the surgical report that patient is allowed WBAT with the hinged knee brace locked into extension.  The patient arrived to therapy ambulating NWB with bilateral axillary crutches and the knee brace locked into extension.  The patient currently has 0 to 55 degrees of left knee active range of motion.  She is able to perform a left straight leg raise with a 5 degree extensor lag.  The patient's bedroom is on the second level.  Provided training for stair performance with use of bilateral axillary crutches.  Advised the patient to put PWB through the left LE for safety.  Recommend physical therpay to return to premorbid state.    Examination-Activity Limitations Bathing;Locomotion Level;Transfers;Bed Mobility;Caring for Others;Carry;Sleep;Dressing;Lift;Stand;Stairs;Squat    Examination-Participation Restrictions Cleaning;Church;Meal Prep;Occupation;Driving;Yard Work;Volunteer;Laundry;Shop;Community Activity    Stability/Clinical Decision Making Stable/Uncomplicated    Clinical Decision Making Low    Rehab Potential Good    PT Frequency 2x / week    PT Duration 12 weeks    PT Treatment/Interventions ADLs/Self Care Home  Management;Aquatic Therapy;Cryotherapy;Electrical Stimulation;Moist Heat;Gait training;Stair training;Functional mobility training;Therapeutic activities;Therapeutic exercise;Balance training;Neuromuscular re-education;Manual techniques;Patient/family education;Passive range of motion;Dry needling;Joint Manipulations;Spinal Manipulations;Taping    PT Next Visit Plan review HEP, quadriceps strengthening, ROM per the protocol, hip strengthening, stretches    PT Home Exercise Plan Access Code: [email or web address]    Consulted and Agree with Plan of Care Patient             Patient will benefit from skilled therapeutic intervention in order to improve the following deficits and impairments:  Abnormal gait, Decreased range of motion, Difficulty walking, Pain, Decreased balance, Impaired flexibility, Improper body mechanics, Decreased mobility, Decreased strength, Increased edema  Visit Diagnosis: Acute pain of left knee  Difficulty in walking, not elsewhere classified  Muscle weakness (generalized)  Decreased range of motion of left knee     Problem List Patient Active Problem List   Diagnosis Date Noted   BMI 27.0-27.9,adult    Sharol Roussel, PT, DPT, OCS, Crt. DN  Robet Leu 06/07/2021, 12:04 PM  [facility] [address] [place], [place], [address] Phone: [phone]   Fax:  [phone]  Name: EVIN CHIRCO MRN: [id number] Date of Birth: April 26, 1981

## 2021-06-07 NOTE — Patient Instructions (Signed)
Access Code: [email or web address] URL: [email or web address] Date: 06/07/2021 Prepared by: Sharol Roussel  Exercises Supine Single Leg Ankle Pumps - 4 x daily - 7 x weekly - 1 sets - 20 reps Long Sitting Quad Set - 2 x daily - 7 x weekly - 1 sets - 15 reps - 5 hold Small Range Straight Leg Raise - 2 x daily - 7 x weekly - 3 sets - 10 reps - 3 hold Sidelying Hip Abduction at Wall - 1 x daily - 7 x weekly - 2 sets - 10 reps Supine Heel Slide - 2 x daily - 7 x weekly - 2 sets - 10 reps Seated Table Hamstring Stretch - 2 x daily - 7 x weekly - 1 sets - 3 reps - 20 hold Long Sitting Calf Stretch with Strap - 2 x daily - 7 x weekly - 1 sets - 3 reps - 20 hold

## 2021-06-10 ENCOUNTER — Ambulatory Visit: Payer: 59 | Admitting: Physical Therapy

## 2021-06-10 ENCOUNTER — Encounter: Payer: Self-pay | Admitting: Physical Therapy

## 2021-06-10 DIAGNOSIS — R262 Difficulty in walking, not elsewhere classified: Secondary | ICD-10-CM

## 2021-06-10 DIAGNOSIS — M25562 Pain in left knee: Secondary | ICD-10-CM | POA: Diagnosis not present

## 2021-06-10 DIAGNOSIS — M6281 Muscle weakness (generalized): Secondary | ICD-10-CM

## 2021-06-10 DIAGNOSIS — M25662 Stiffness of left knee, not elsewhere classified: Secondary | ICD-10-CM

## 2021-06-10 NOTE — Therapy (Signed)
[facility] [address] [place], [place], [address] Phone: [phone]   Fax:  [phone]  Physical Therapy Treatment  Patient Details  Name: Megan Bryan MRN: [id number] Date of Birth: 1981-04-01 Referring Provider (PT): Bjorn Pippin, MD   Encounter Date: 06/10/2021   PT End of Session - 06/10/21 0944     Visit Number 2    Number of Visits 24    Date for PT Re-Evaluation 08/30/21    Authorization Type [employer]    PT Start Time [phone]    PT Stop Time 1020    PT Time Calculation (min) 46 min    Activity Tolerance Patient tolerated treatment well             Past Medical History:  Diagnosis Date   BMI 27.0-27.9,adult     Past Surgical History:  Procedure Laterality Date   ANTERIOR CRUCIATE LIGAMENT REPAIR Left 06/05/2021   Procedure: ANTERIOR CRUCIATE LIGAMENT (ACL) REPAIR with patellar tendon graft;  Surgeon: Bjorn Pippin, MD;  Location: [facility];  Service: Orthopedics;  Laterality: Left;   KNEE ARTHROSCOPY WITH LATERAL MENISECTOMY Left 06/05/2021   Procedure: KNEE ARTHROSCOPY WITH LATERAL MENISECTOMY;  Surgeon: Bjorn Pippin, MD;  Location: [facility];  Service: Orthopedics;  Laterality: Left;   KNEE ARTHROSCOPY WITH MEDIAL MENISECTOMY Left 06/05/2021   Procedure: KNEE ARTHROSCOPY WITH MEDIAL MENISECTOMY;  Surgeon: Bjorn Pippin, MD;  Location: [facility];  Service: Orthopedics;  Laterality: Left;   TUBAL LIGATION      There were no vitals filed for this visit.   Subjective Assessment - 06/10/21 0939     Subjective " I tihnk I am doing pretty good, I only get a little pain sitting which is more just uncomfortable."    Patient Stated Goals To get back to being active, running, and playing basketball.    Currently in Pain? Yes    Pain Score 3     Pain Location Knee    Pain Orientation Left    Pain Descriptors / Indicators Aching    Pain Type Chronic  pain    Pain Onset More than a month ago    Pain Frequency Intermittent                [facility] PT Assessment - 06/10/21 0001       PROM   PROM Assessment Site Knee    Right/Left Knee Left    Left Knee Flexion 50                           [facility] Adult PT Treatment/Exercise - 06/10/21 0001       Blood Flow Restriction   Blood Flow Restriction Yes      Blood Flow Restriction-Positions    Blood Flow Restriction Position Sitting;Supine      BFR-Supine   Supine Limb Occulsion Pressure (mmHg) 210    Supine Exercise Pressure (mmHg) 126    Supine Exercise Prescription 30,15,15,15, reps w/ 30-60 sec rest    Supine Exercise Prescription Comment quad set and SLR      BFR Sitting   Sitting Limb Occulsion Pressure (mmHg) --    Sitting Exercise Pressure (mmHg) --    Sitting Exercise Prescription --    Sitting Exercise Prescription Comment --      Knee/Hip Exercises: Stretches   Passive Hamstring Stretch 2 reps;Left;30 seconds      Knee/Hip  Exercises: Supine   Heel Slides AAROM;Strengthening;Left;1 set;15 reps      Modalities   Modalities Vasopneumatic      Vasopneumatic   Number Minutes Vasopneumatic  10 minutes    Vasopnuematic Location  Knee    Vasopneumatic Pressure Medium    Vasopneumatic Temperature  34      Manual Therapy   Manual Therapy Joint mobilization;Passive ROM    Manual therapy comments tibiofemoral AP grade III    Passive ROM knee flexion with gentle overpressur working to available end range                      PT Short Term Goals - 06/07/21 0859       PT SHORT TERM GOAL #1   Title The patient will be independent in an initial ACLR home exercise program.    Baseline none    Time 2    Period Weeks    Status New    Target Date 06/28/21      PT SHORT TERM GOAL #2   Title The patient will present with improvement of left knee flexion active motion to 90 degrees for transfers and ambulation.    Baseline 55 deg     Time 4    Period Weeks    Status New    Target Date 07/05/21      PT SHORT TERM GOAL #3   Title The patient will be able to perform 30 reps of left SLR without an extensor lag for transition to waking without an AD.    Baseline 5 deg extensor lag    Time 4    Period Weeks    Status New    Target Date 07/05/21               PT Long Term Goals - 06/07/21 0927       PT LONG TERM GOAL #1   Title The patient will have an improved FOTO score to 69% or better for return to premorbid activities.    Baseline 34%    Time 12    Period Weeks    Status New    Target Date 08/30/21      PT LONG TERM GOAL #2   Title The patient will be able to perform an eight inch step down with no compensation and good left knee eccentric control for stairs    Baseline step to pattern w/ crutches    Time 12    Period Weeks    Status New    Target Date 08/30/21      PT LONG TERM GOAL #3   Title The patient will be able to perform a single leg stance on foam x 30 second or greater for return to community work with childern.    Baseline unable to test    Time 12    Period Weeks    Status New    Target Date 08/30/21      PT LONG TERM GOAL #4   Title The patient will be able to squat to full depth with equal weight bearing for lifting.    Baseline unable to test    Time 12    Period Weeks    Status New    Target Date 08/30/21      PT LONG TERM GOAL #5   Title The patient will be able to complete a functonal hop test or Y balance testing and score a 90% or better for return  to recreational and community activities.    Baseline not tested    Time 18    Period Weeks    Status New    Target Date 10/11/21                   Plan - 06/10/21 1016     Clinical Impression Statement pt reports consistency with her HEP and demonstrates a good quad contraction. utilized BFR for quad set and SLR which she did well with. worked on knee AP mobs and passive flexion which she did well  increasing to 50 degrees today. end of session utilized vaso to calm down pain and soreness following session.    PT Next Visit Plan review HEP, quadriceps strengthening, ROM per the protocol, hip strengthening, stretches    PT Home Exercise Plan Access Code: [email or web address]    Consulted and Agree with Plan of Care Patient             Patient will benefit from skilled therapeutic intervention in order to improve the following deficits and impairments:  Abnormal gait, Decreased range of motion, Difficulty walking, Pain, Decreased balance, Impaired flexibility, Improper body mechanics, Decreased mobility, Decreased strength, Increased edema  Visit Diagnosis: Acute pain of left knee  Difficulty in walking, not elsewhere classified  Muscle weakness (generalized)  Decreased range of motion of left knee     Problem List Patient Active Problem List   Diagnosis Date Noted   BMI 27.0-27.9,adult     Lulu Riding PT, DPT, LAT, ATC  06/10/21  10:18 AM     [facility] [address] [place], [place], [address] Phone: [phone]   Fax:  [phone]  Name: YAZMEN BRIONES MRN: [id number] Date of Birth: [birth year]

## 2021-06-12 ENCOUNTER — Encounter: Payer: Self-pay | Admitting: Physical Therapy

## 2021-06-12 ENCOUNTER — Other Ambulatory Visit: Payer: Self-pay

## 2021-06-12 ENCOUNTER — Ambulatory Visit: Payer: 59 | Admitting: Physical Therapy

## 2021-06-12 DIAGNOSIS — M25562 Pain in left knee: Secondary | ICD-10-CM

## 2021-06-12 DIAGNOSIS — R262 Difficulty in walking, not elsewhere classified: Secondary | ICD-10-CM

## 2021-06-12 DIAGNOSIS — M25662 Stiffness of left knee, not elsewhere classified: Secondary | ICD-10-CM

## 2021-06-12 DIAGNOSIS — M6281 Muscle weakness (generalized): Secondary | ICD-10-CM

## 2021-06-12 NOTE — Therapy (Signed)
[facility] Outpatient Rehabilitation [facility] [address] [place], [place], [address] Phone: [phone]   Fax:  [phone]  Physical Therapy Treatment  Patient Details  Name: FRANCESSCA FRIIS MRN: [id number] Date of Birth: 1981-08-15 Referring Provider (PT): Bjorn Pippin, MD   Encounter Date: 06/12/2021   PT End of Session - 06/12/21 0851     Visit Number 3    Number of Visits 24    Date for PT Re-Evaluation 08/30/21    Authorization Type [employer]    PT Start Time 0848    PT Stop Time 0936    PT Time Calculation (min) 48 min    Activity Tolerance Patient tolerated treatment well    Behavior During Therapy [facility] for tasks assessed/performed             Past Medical History:  Diagnosis Date   BMI 27.0-27.9,adult     Past Surgical History:  Procedure Laterality Date   ANTERIOR CRUCIATE LIGAMENT REPAIR Left 06/05/2021   Procedure: ANTERIOR CRUCIATE LIGAMENT (ACL) REPAIR with patellar tendon graft;  Surgeon: Bjorn Pippin, MD;  Location: [facility];  Service: Orthopedics;  Laterality: Left;   KNEE ARTHROSCOPY WITH LATERAL MENISECTOMY Left 06/05/2021   Procedure: KNEE ARTHROSCOPY WITH LATERAL MENISECTOMY;  Surgeon: Bjorn Pippin, MD;  Location: [facility];  Service: Orthopedics;  Laterality: Left;   KNEE ARTHROSCOPY WITH MEDIAL MENISECTOMY Left 06/05/2021   Procedure: KNEE ARTHROSCOPY WITH MEDIAL MENISECTOMY;  Surgeon: Bjorn Pippin, MD;  Location: [facility];  Service: Orthopedics;  Laterality: Left;   TUBAL LIGATION      There were no vitals filed for this visit.   Subjective Assessment - 06/12/21 0851     Subjective "was alittle sore after the last session,but it wasn't bad."    Patient Stated Goals To get back to being active, running, and playing basketball.    Currently in Pain? Yes    Pain Score 2     Pain Location Knee    Pain Orientation Left    Pain Descriptors / Indicators Aching     Pain Type Chronic pain    Pain Onset More than a month ago    Pain Frequency Intermittent                [facility] PT Assessment - 06/12/21 0001       Assessment   Medical Diagnosis s/p L Knee scope lateral meniscectomy, ACL repair, Laeral meniscus repair SX 06/05/21    Referring Provider (PT) Bjorn Pippin, MD      AROM   Left Knee Flexion 58   81 following manual techniques.                          [facility] Adult PT Treatment/Exercise - 06/12/21 0001       Vasopneumatic   Number Minutes Vasopneumatic  10 minutes    Vasopnuematic Location  Knee    Vasopneumatic Pressure Medium    Vasopneumatic Temperature  34             [facility] Adult PT Treatment/Exercise:  Therapeutic Exercise: Quad stetch 2 x 30 in thomas test pos. Hamstring stretch 2 x 30 sec Seated heel slides 1 x 12  Recumbent bike x 6 min half revolutions Sit to stand from table 1 x 10   Manual Therapy: AP tibiofemoral mobs grade III with intermittent active hamstring curl MTPR along the L quad x  3 IASTM along the rectus femoris and vastus lateralsi       PT Education - 06/12/21 0928     Education Details reviewed and updated HEP today.    Person(s) Educated Patient    Methods Explanation;Verbal cues    Comprehension Verbalized understanding;Verbal cues required              PT Short Term Goals - 06/07/21 0859       PT SHORT TERM GOAL #1   Title The patient will be independent in an initial ACLR home exercise program.    Baseline none    Time 2    Period Weeks    Status New    Target Date 06/28/21      PT SHORT TERM GOAL #2   Title The patient will present with improvement of left knee flexion active motion to 90 degrees for transfers and ambulation.    Baseline 55 deg    Time 4    Period Weeks    Status New    Target Date 07/05/21      PT SHORT TERM GOAL #3   Title The patient will be able to perform 30 reps of left SLR without an extensor lag for transition to  waking without an AD.    Baseline 5 deg extensor lag    Time 4    Period Weeks    Status New    Target Date 07/05/21               PT Long Term Goals - 06/07/21 0927       PT LONG TERM GOAL #1   Title The patient will have an improved FOTO score to 69% or better for return to premorbid activities.    Baseline 34%    Time 12    Period Weeks    Status New    Target Date 08/30/21      PT LONG TERM GOAL #2   Title The patient will be able to perform an eight inch step down with no compensation and good left knee eccentric control for stairs    Baseline step to pattern w/ crutches    Time 12    Period Weeks    Status New    Target Date 08/30/21      PT LONG TERM GOAL #3   Title The patient will be able to perform a single leg stance on foam x 30 second or greater for return to community work with childern.    Baseline unable to test    Time 12    Period Weeks    Status New    Target Date 08/30/21      PT LONG TERM GOAL #4   Title The patient will be able to squat to full depth with equal weight bearing for lifting.    Baseline unable to test    Time 12    Period Weeks    Status New    Target Date 08/30/21      PT LONG TERM GOAL #5   Title The patient will be able to complete a functonal hop test or Y balance testing and score a 90% or better for return to recreational and community activities.    Baseline not tested    Time 18    Period Weeks    Status New    Target Date 10/11/21  Plan - 06/12/21 0929     Clinical Impression Statement pt arrives reporting some soreness today and is increasing her knee ROM with following manual techiques increased to 81 degrees today. continued focus on ROM and quad stretching / activation. End of session continued use of Game ready for pain and inflammation.    Examination-Activity Limitations Bathing;Locomotion Level;Transfers;Bed Mobility;Caring for  Others;Carry;Sleep;Dressing;Lift;Stand;Stairs;Squat    PT Treatment/Interventions ADLs/Self Care Home Management;Aquatic Therapy;Cryotherapy;Electrical Stimulation;Moist Heat;Gait training;Stair training;Functional mobility training;Therapeutic activities;Therapeutic exercise;Balance training;Neuromuscular re-education;Manual techniques;Patient/family education;Passive range of motion;Dry needling;Joint Manipulations;Spinal Manipulations;Taping    PT Next Visit Plan review HEP, quadriceps strengthening, ROM per the protocol, hip strengthening, stretches    PT Home Exercise Plan Access Code: [email or web address]             Patient will benefit from skilled therapeutic intervention in order to improve the following deficits and impairments:  Abnormal gait, Decreased range of motion, Difficulty walking, Pain, Decreased balance, Impaired flexibility, Improper body mechanics, Decreased mobility, Decreased strength, Increased edema  Visit Diagnosis: Acute pain of left knee  Difficulty in walking, not elsewhere classified  Muscle weakness (generalized)  Decreased range of motion of left knee     Problem List Patient Active Problem List   Diagnosis Date Noted   BMI 27.0-27.9,adult    Lulu Riding PT, DPT, LAT, ATC  06/12/21  9:33 AM      [facility] Outpatient Rehabilitation [facility] [address] [place], [place], [address] Phone: [phone]   Fax:  [phone]  Name: ETHIE CURLESS MRN: [id number] Date of Birth: [birth year]

## 2021-06-14 ENCOUNTER — Other Ambulatory Visit (HOSPITAL_COMMUNITY): Payer: Self-pay | Admitting: Orthopaedic Surgery

## 2021-06-14 ENCOUNTER — Other Ambulatory Visit: Payer: Self-pay

## 2021-06-14 ENCOUNTER — Ambulatory Visit (HOSPITAL_COMMUNITY)
Admission: RE | Admit: 2021-06-14 | Discharge: 2021-06-14 | Disposition: A | Discharge status: Home / Self Care | Payer: 59 | Source: Ambulatory Visit | Attending: Vascular Surgery | Admitting: Vascular Surgery

## 2021-06-14 DIAGNOSIS — M79605 Pain in left leg: Secondary | ICD-10-CM

## 2021-06-17 ENCOUNTER — Encounter: Payer: Self-pay | Admitting: Physical Therapy

## 2021-06-17 ENCOUNTER — Other Ambulatory Visit: Payer: Self-pay

## 2021-06-17 ENCOUNTER — Ambulatory Visit: Payer: 59 | Admitting: Physical Therapy

## 2021-06-17 DIAGNOSIS — M25562 Pain in left knee: Secondary | ICD-10-CM

## 2021-06-17 DIAGNOSIS — M6281 Muscle weakness (generalized): Secondary | ICD-10-CM

## 2021-06-17 DIAGNOSIS — M25662 Stiffness of left knee, not elsewhere classified: Secondary | ICD-10-CM

## 2021-06-17 DIAGNOSIS — R262 Difficulty in walking, not elsewhere classified: Secondary | ICD-10-CM

## 2021-06-17 NOTE — Therapy (Signed)
[facility] Outpatient Rehabilitation [facility] [address] [place], [place], [address] Phone: [phone]   Fax:  [phone]  Physical Therapy Treatment  Patient Details  Name: Megan Bryan MRN: [id number] Date of Birth: [birth year] Referring Provider (PT): Bjorn Pippin, MD   Encounter Date: 06/17/2021   PT End of Session - 06/17/21 0841     Visit Number 4    Number of Visits 24    Date for PT Re-Evaluation 08/30/21    Authorization Type [employer]    PT Start Time [phone]    PT Stop Time 0940    PT Time Calculation (min) 59 min    Activity Tolerance Patient tolerated treatment well    Behavior During Therapy [facility] for tasks assessed/performed             Past Medical History:  Diagnosis Date   BMI 27.0-27.9,adult     Past Surgical History:  Procedure Laterality Date   ANTERIOR CRUCIATE LIGAMENT REPAIR Left 06/05/2021   Procedure: ANTERIOR CRUCIATE LIGAMENT (ACL) REPAIR with patellar tendon graft;  Surgeon: Bjorn Pippin, MD;  Location: [facility];  Service: Orthopedics;  Laterality: Left;   KNEE ARTHROSCOPY WITH LATERAL MENISECTOMY Left 06/05/2021   Procedure: KNEE ARTHROSCOPY WITH LATERAL MENISECTOMY;  Surgeon: Bjorn Pippin, MD;  Location: [facility];  Service: Orthopedics;  Laterality: Left;   KNEE ARTHROSCOPY WITH MEDIAL MENISECTOMY Left 06/05/2021   Procedure: KNEE ARTHROSCOPY WITH MEDIAL MENISECTOMY;  Surgeon: Bjorn Pippin, MD;  Location: [facility];  Service: Orthopedics;  Laterality: Left;   TUBAL LIGATION      There were no vitals filed for this visit.   Subjective Assessment - 06/17/21 0841     Subjective " I feel like I am getting more bending. I did more walking I think I am used to and it made me alittle more stiff."    Patient Stated Goals To get back to being active, running, and playing basketball.    Currently in Pain? Yes    Pain Score 0-No pain    Pain Orientation Left     Aggravating Factors  walking too much/ prolonged standing. deep knee bending.                [facility] PT Assessment - 06/17/21 0001       Assessment   Medical Diagnosis s/p L Knee scope lateral meniscectomy, ACL repair, Laeral meniscus repair SX 06/05/21    Referring Provider (PT) Bjorn Pippin, MD      AROM   Left Knee Flexion 65                       [facility] Adult PT Treatment/Exercise:  Therapeutic Exercise: Recumbent bike 1/2 revolutions x 6 min  Hamstring stretch 2 x 30 secon Quad stretch in thomas test pos 2 x 30  SLR performed with BFR @ 80% occlusion R sidelying hip abduction 1 x 15 Prone hamstring curl with hip ext 1 x 10   Manual Therapy: MTPR along the quad x 3, and biceps femoris x 2 Patellar mobs med<>lateral and inferior mobs for flexion Tibiofemoral AP mobs grade III     [facility] Adult PT Treatment/Exercise - 06/17/21 0001       Blood Flow Restriction   Blood Flow Restriction Yes      Blood Flow Restriction-Positions    Blood Flow Restriction Position Supine      BFR-Supine   Supine Limb  Occulsion Pressure (mmHg) 210    Supine Exercise Pressure (mmHg) 168   80%   Supine Exercise Prescription 30,15,15,15, reps w/ 30-60 sec rest    Supine Exercise Prescription Comment SLR      Vasopneumatic   Number Minutes Vasopneumatic  10 minutes    Vasopnuematic Location  Knee    Vasopneumatic Pressure Medium    Vasopneumatic Temperature  34                    PT Education - 06/17/21 0930     Education Details reviewed and updated HEP today    Person(s) Educated Patient    Methods Explanation;Verbal cues;Handout    Comprehension Verbalized understanding;Verbal cues required              PT Short Term Goals - 06/07/21 0859       PT SHORT TERM GOAL #1   Title The patient will be independent in an initial ACLR home exercise program.    Baseline none    Time 2    Period Weeks    Status New    Target Date 06/28/21      PT  SHORT TERM GOAL #2   Title The patient will present with improvement of left knee flexion active motion to 90 degrees for transfers and ambulation.    Baseline 55 deg    Time 4    Period Weeks    Status New    Target Date 07/05/21      PT SHORT TERM GOAL #3   Title The patient will be able to perform 30 reps of left SLR without an extensor lag for transition to waking without an AD.    Baseline 5 deg extensor lag    Time 4    Period Weeks    Status New    Target Date 07/05/21               PT Long Term Goals - 06/07/21 0927       PT LONG TERM GOAL #1   Title The patient will have an improved FOTO score to 69% or better for return to premorbid activities.    Baseline 34%    Time 12    Period Weeks    Status New    Target Date 08/30/21      PT LONG TERM GOAL #2   Title The patient will be able to perform an eight inch step down with no compensation and good left knee eccentric control for stairs    Baseline step to pattern w/ crutches    Time 12    Period Weeks    Status New    Target Date 08/30/21      PT LONG TERM GOAL #3   Title The patient will be able to perform a single leg stance on foam x 30 second or greater for return to community work with childern.    Baseline unable to test    Time 12    Period Weeks    Status New    Target Date 08/30/21      PT LONG TERM GOAL #4   Title The patient will be able to squat to full depth with equal weight bearing for lifting.    Baseline unable to test    Time 12    Period Weeks    Status New    Target Date 08/30/21      PT LONG TERM GOAL #5   Title  The patient will be able to complete a functonal hop test or Y balance testing and score a 90% or better for return to recreational and community activities.    Baseline not tested    Time 18    Period Weeks    Status New    Target Date 10/11/21                   Plan - 06/17/21 0915     Clinical Impression Statement pt saw her MD since the last  session and reports she is doing well. continued working on knee ROM which today she increased to 65 degrees AROM knee flexion. pt is 2 weeks today, continued working on quad activation staying within protocol limiting OKC. unlocked pt's brace to 30 degrees today to promote knee flexion. updated HEP todya to include prone hamsting curl and hip ext.    PT Treatment/Interventions ADLs/Self Care Home Management;Aquatic Therapy;Cryotherapy;Electrical Stimulation;Moist Heat;Gait training;Stair training;Functional mobility training;Therapeutic activities;Therapeutic exercise;Balance training;Neuromuscular re-education;Manual techniques;Patient/family education;Passive range of motion;Dry needling;Joint Manipulations;Spinal Manipulations;Taping    PT Next Visit Plan review HEP, quadriceps strengthening, ROM per the protocol, hip strengthening, stretches    PT Home Exercise Plan Access Code: [email or web address]    Consulted and Agree with Plan of Care Patient             Patient will benefit from skilled therapeutic intervention in order to improve the following deficits and impairments:  Abnormal gait, Decreased range of motion, Difficulty walking, Pain, Decreased balance, Impaired flexibility, Improper body mechanics, Decreased mobility, Decreased strength, Increased edema  Visit Diagnosis: Acute pain of left knee  Difficulty in walking, not elsewhere classified  Muscle weakness (generalized)  Decreased range of motion of left knee     Problem List Patient Active Problem List   Diagnosis Date Noted   BMI 27.0-27.9,adult     Lulu Riding PT, DPT, LAT, ATC  06/17/21  9:38 AM      [facility] Outpatient Rehabilitation [facility] [address] [place], [place], [address] Phone: [phone]   Fax:  [phone]  Name: Megan Bryan MRN: [id number] Date of Birth: June 07, 1981

## 2021-06-19 ENCOUNTER — Other Ambulatory Visit: Payer: Self-pay

## 2021-06-19 ENCOUNTER — Ambulatory Visit: Payer: 59 | Admitting: Physical Therapy

## 2021-06-19 DIAGNOSIS — R262 Difficulty in walking, not elsewhere classified: Secondary | ICD-10-CM

## 2021-06-19 DIAGNOSIS — M25562 Pain in left knee: Secondary | ICD-10-CM | POA: Diagnosis not present

## 2021-06-19 DIAGNOSIS — M25662 Stiffness of left knee, not elsewhere classified: Secondary | ICD-10-CM

## 2021-06-19 DIAGNOSIS — M6281 Muscle weakness (generalized): Secondary | ICD-10-CM

## 2021-06-19 NOTE — Therapy (Signed)
[facility] Outpatient Rehabilitation [facility] [address] [place], [place], [address] Phone: [phone]   Fax:  [phone]  Physical Therapy Treatment  Patient Details  Name: Megan Bryan MRN: [id number] Date of Birth: 1981/05/01 Referring Provider (PT): Bjorn Pippin, MD   Encounter Date: 06/19/2021   PT End of Session - 06/19/21 0848     Visit Number 5    Number of Visits 24    Date for PT Re-Evaluation 08/30/21    Authorization Type [employer]    PT Start Time [phone]    PT Stop Time 0930    PT Time Calculation (min) 44 min             Past Medical History:  Diagnosis Date   BMI 27.0-27.9,adult     Past Surgical History:  Procedure Laterality Date   ANTERIOR CRUCIATE LIGAMENT REPAIR Left 06/05/2021   Procedure: ANTERIOR CRUCIATE LIGAMENT (ACL) REPAIR with patellar tendon graft;  Surgeon: Bjorn Pippin, MD;  Location: [facility];  Service: Orthopedics;  Laterality: Left;   KNEE ARTHROSCOPY WITH LATERAL MENISECTOMY Left 06/05/2021   Procedure: KNEE ARTHROSCOPY WITH LATERAL MENISECTOMY;  Surgeon: Bjorn Pippin, MD;  Location: [facility];  Service: Orthopedics;  Laterality: Left;   KNEE ARTHROSCOPY WITH MEDIAL MENISECTOMY Left 06/05/2021   Procedure: KNEE ARTHROSCOPY WITH MEDIAL MENISECTOMY;  Surgeon: Bjorn Pippin, MD;  Location: [facility];  Service: Orthopedics;  Laterality: Left;   TUBAL LIGATION      There were no vitals filed for this visit.       [facility] PT Assessment - 06/19/21 0001       AROM   Left Knee Extension 5   supine knee extension   Left Knee Flexion 86                           [facility] Adult PT Treatment/Exercise - 06/19/21 0001       Self-Care   Self-Care Other Self-Care Comments    Other Self-Care Comments  Review and issued patient protocol - instucted pt in self patella mobilization and PT progression      Knee/Hip Exercises: Aerobic   Stationary  Bike no resistance partial revolutions      Knee/Hip Exercises: Standing   Functional Squat 10 reps;2 sets   UE on bars   Functional Squat Limitations with 40 degree hinge brace    Other Standing Knee Exercises weight shifting lateral 10 sec x 5 without UE      Knee/Hip Exercises: Supine   Quad Sets Left;15 reps    Heel Slides AAROM    Straight Leg Raises 10 reps      Knee/Hip Exercises: Sidelying   Hip ABduction 10 reps      Knee/Hip Exercises: Prone   Prone Knee Hang 5 minutes                      PT Short Term Goals - 06/07/21 0859       PT SHORT TERM GOAL #1   Title The patient will be independent in an initial ACLR home exercise program.    Baseline none    Time 2    Period Weeks    Status New    Target Date 06/28/21      PT SHORT TERM GOAL #2   Title The patient will present with improvement of left knee flexion active motion to 90  degrees for transfers and ambulation.    Baseline 55 deg    Time 4    Period Weeks    Status New    Target Date 07/05/21      PT SHORT TERM GOAL #3   Title The patient will be able to perform 30 reps of left SLR without an extensor lag for transition to waking without an AD.    Baseline 5 deg extensor lag    Time 4    Period Weeks    Status New    Target Date 07/05/21               PT Long Term Goals - 06/07/21 0927       PT LONG TERM GOAL #1   Title The patient will have an improved FOTO score to 69% or better for return to premorbid activities.    Baseline 34%    Time 12    Period Weeks    Status New    Target Date 08/30/21      PT LONG TERM GOAL #2   Title The patient will be able to perform an eight inch step down with no compensation and good left knee eccentric control for stairs    Baseline step to pattern w/ crutches    Time 12    Period Weeks    Status New    Target Date 08/30/21      PT LONG TERM GOAL #3   Title The patient will be able to perform a single leg stance on foam x 30 second  or greater for return to community work with childern.    Baseline unable to test    Time 12    Period Weeks    Status New    Target Date 08/30/21      PT LONG TERM GOAL #4   Title The patient will be able to squat to full depth with equal weight bearing for lifting.    Baseline unable to test    Time 12    Period Weeks    Status New    Target Date 08/30/21      PT LONG TERM GOAL #5   Title The patient will be able to complete a functonal hop test or Y balance testing and score a 90% or better for return to recreational and community activities.    Baseline not tested    Time 18    Period Weeks    Status New    Target Date 10/11/21                   Plan - 06/19/21 1048     Clinical Impression Statement Pt demonstrates improved flexion ROM. Time spent with review of PT protocol. Instructed pt in self patella mobs in all planes. Began prone hang with progressive visual improvement noted in knee extension after 5 minutes. Began mini squats in hinged brace. Encouraged transition to one crutch. Pt demonstrates appropriate strength to transition to FWB.    PT Next Visit Plan review HEP, quadriceps strengthening, ROM per the protocol, hip strengthening, stretches, gait    PT Home Exercise Plan Access Code: [email or web address]             Patient will benefit from skilled therapeutic intervention in order to improve the following deficits and impairments:  Abnormal gait, Decreased range of motion, Difficulty walking, Pain, Decreased balance, Impaired flexibility, Improper body mechanics, Decreased mobility, Decreased strength, Increased edema  Visit Diagnosis: No  diagnosis found.     Problem List Patient Active Problem List   Diagnosis Date Noted   BMI 27.0-27.9,adult     Sherrie Mustache, [place] 06/19/2021, 12:16 PM  [facility] [address] [place], [place], [address] Phone: [phone]   Fax:  [phone]  Name:  Megan Bryan MRN: [id number] Date of Birth: 08-23-1981

## 2021-06-25 ENCOUNTER — Ambulatory Visit: Payer: 59 | Attending: Orthopaedic Surgery | Admitting: Physical Therapy

## 2021-06-25 ENCOUNTER — Other Ambulatory Visit: Payer: Self-pay

## 2021-06-25 ENCOUNTER — Encounter: Payer: Self-pay | Admitting: Physical Therapy

## 2021-06-25 DIAGNOSIS — M25562 Pain in left knee: Secondary | ICD-10-CM

## 2021-06-25 DIAGNOSIS — M25662 Stiffness of left knee, not elsewhere classified: Secondary | ICD-10-CM | POA: Diagnosis present

## 2021-06-25 DIAGNOSIS — R262 Difficulty in walking, not elsewhere classified: Secondary | ICD-10-CM | POA: Diagnosis present

## 2021-06-25 DIAGNOSIS — M6281 Muscle weakness (generalized): Secondary | ICD-10-CM | POA: Diagnosis present

## 2021-06-25 NOTE — Therapy (Signed)
[facility] Outpatient Rehabilitation [facility] [address] [place], [place], [address] Phone: [phone]   Fax:  [phone]  Physical Therapy Treatment  Patient Details  Name: Megan Bryan MRN: [id number] Date of Birth: [birth year] Referring Provider (PT): Bjorn Pippin, MD   Encounter Date: 06/25/2021   PT End of Session - 06/25/21 0854     Visit Number 6    Number of Visits 24    Date for PT Re-Evaluation 08/30/21    Authorization Type [employer]    PT Start Time [phone]    PT Stop Time 0929    PT Time Calculation (min) 40 min    Activity Tolerance Patient tolerated treatment well    Behavior During Therapy [facility] for tasks assessed/performed             Past Medical History:  Diagnosis Date   BMI 27.0-27.9,adult     Past Surgical History:  Procedure Laterality Date   ANTERIOR CRUCIATE LIGAMENT REPAIR Left 06/05/2021   Procedure: ANTERIOR CRUCIATE LIGAMENT (ACL) REPAIR with patellar tendon graft;  Surgeon: Bjorn Pippin, MD;  Location: [facility];  Service: Orthopedics;  Laterality: Left;   KNEE ARTHROSCOPY WITH LATERAL MENISECTOMY Left 06/05/2021   Procedure: KNEE ARTHROSCOPY WITH LATERAL MENISECTOMY;  Surgeon: Bjorn Pippin, MD;  Location: [facility];  Service: Orthopedics;  Laterality: Left;   KNEE ARTHROSCOPY WITH MEDIAL MENISECTOMY Left 06/05/2021   Procedure: KNEE ARTHROSCOPY WITH MEDIAL MENISECTOMY;  Surgeon: Bjorn Pippin, MD;  Location: [facility];  Service: Orthopedics;  Laterality: Left;   TUBAL LIGATION      There were no vitals filed for this visit.   Subjective Assessment - 06/25/21 0850     Subjective "I am only a little sore today. I did some workout with my daughter the other day."    Patient Stated Goals To get back to being active, running, and playing basketball.    Currently in Pain? Yes    Pain Score 0-No pain    Pain Orientation Left    Pain Descriptors / Indicators  Aching    Pain Onset More than a month ago    Pain Frequency Intermittent    Aggravating Factors  doing too much    Pain Relieving Factors rest, ice                [facility] PT Assessment - 06/25/21 0001       Assessment   Medical Diagnosis s/p L Knee scope lateral meniscectomy, ACL repair, Laeral meniscus repair SX 06/05/21    Referring Provider (PT) Bjorn Pippin, MD      Observation/Other Assessments   Focus on Therapeutic Outcomes (FOTO)  52%      AROM   Left Knee Extension 4    Left Knee Flexion 98                              [facility] Adult PT Treatment/Exercise:  Therapeutic Exercise: Recumbnent bike 1/2 revolutions x 1 min - was able to get full revolutions after x 5 min Seated Hamstring curl 2 x 12 with RTB Stool scoot 2 x 50 ft Sit to stand with LLE advanced forward  2 x 10  Quad set and SLR 2 x 10  Manual Therapy:  MTPR along the rectus femoris and vastus lateralis  Tibiofemoral mobs AP grade IV   Therapeutic Activity: Gait training in // x 5  forward/ bwd  cues for heel stike / toe off, and reverse with backward walking with intermittnet cues for proper form.       PT Education - 06/25/21 [phone]     Education Details reviewed HEP and, FOTO assessment    Person(s) Educated Patient    Methods Explanation;Verbal cues    Comprehension Verbalized understanding;Verbal cues required              PT Short Term Goals - 06/07/21 0859       PT SHORT TERM GOAL #1   Title The patient will be independent in an initial ACLR home exercise program.    Baseline none    Time 2    Period Weeks    Status New    Target Date 06/28/21      PT SHORT TERM GOAL #2   Title The patient will present with improvement of left knee flexion active motion to 90 degrees for transfers and ambulation.    Baseline 55 deg    Time 4    Period Weeks    Status New    Target Date 07/05/21      PT SHORT TERM GOAL #3   Title The patient will be able to  perform 30 reps of left SLR without an extensor lag for transition to waking without an AD.    Baseline 5 deg extensor lag    Time 4    Period Weeks    Status New    Target Date 07/05/21               PT Long Term Goals - 06/07/21 0927       PT LONG TERM GOAL #1   Title The patient will have an improved FOTO score to 69% or better for return to premorbid activities.    Baseline 34%    Time 12    Period Weeks    Status New    Target Date 08/30/21      PT LONG TERM GOAL #2   Title The patient will be able to perform an eight inch step down with no compensation and good left knee eccentric control for stairs    Baseline step to pattern w/ crutches    Time 12    Period Weeks    Status New    Target Date 08/30/21      PT LONG TERM GOAL #3   Title The patient will be able to perform a single leg stance on foam x 30 second or greater for return to community work with childern.    Baseline unable to test    Time 12    Period Weeks    Status New    Target Date 08/30/21      PT LONG TERM GOAL #4   Title The patient will be able to squat to full depth with equal weight bearing for lifting.    Baseline unable to test    Time 12    Period Weeks    Status New    Target Date 08/30/21      PT LONG TERM GOAL #5   Title The patient will be able to complete a functonal hop test or Y balance testing and score a 90% or better for return to recreational and community activities.    Baseline not tested    Time 18    Period Weeks    Status New    Target Date 10/11/21  Plan - 06/25/21 0927     Clinical Impression Statement [name] is making great progress with physical therapy increasing her knee ROM 4 - 98 degrees. unlocked knee brace to 90 degrees today and practiced gait training with focus on heel strike/ toe off which she required demonstration and cues for proper form. continued working knee strengthening with SLR combined with quad set. pt  declined vaso/ ice today.    PT Treatment/Interventions ADLs/Self Care Home Management;Aquatic Therapy;Cryotherapy;Electrical Stimulation;Moist Heat;Gait training;Stair training;Functional mobility training;Therapeutic activities;Therapeutic exercise;Balance training;Neuromuscular re-education;Manual techniques;Patient/family education;Passive range of motion;Dry needling;Joint Manipulations;Spinal Manipulations;Taping    PT Next Visit Plan review HEP, quadriceps strengthening, ROM per the protocol, hip strengthening, stretches, continue gait training, CKC strengthening, quad set with sLR (update HEP), standing TKE    PT Home Exercise Plan Access Code: [email or web address]    Consulted and Agree with Plan of Care Patient             Patient will benefit from skilled therapeutic intervention in order to improve the following deficits and impairments:  Abnormal gait, Decreased range of motion, Difficulty walking, Pain, Decreased balance, Impaired flexibility, Improper body mechanics, Decreased mobility, Decreased strength, Increased edema  Visit Diagnosis: Acute pain of left knee  Muscle weakness (generalized)  Difficulty in walking, not elsewhere classified  Decreased range of motion of left knee     Problem List Patient Active Problem List   Diagnosis Date Noted   BMI 27.0-27.9,adult    Lulu Riding PT, DPT, LAT, ATC  06/25/21  9:41 AM     [facility] Outpatient Rehabilitation [facility] [address] [place], [place], [address] Phone: [phone]   Fax:  [phone]  Name: Megan Bryan MRN: [id number] Date of Birth: 31-Jul-1981

## 2021-06-27 ENCOUNTER — Ambulatory Visit: Payer: 59 | Admitting: Physical Therapy

## 2021-06-27 ENCOUNTER — Other Ambulatory Visit: Payer: Self-pay

## 2021-06-27 ENCOUNTER — Encounter: Payer: Self-pay | Admitting: Physical Therapy

## 2021-06-27 DIAGNOSIS — R262 Difficulty in walking, not elsewhere classified: Secondary | ICD-10-CM

## 2021-06-27 DIAGNOSIS — M6281 Muscle weakness (generalized): Secondary | ICD-10-CM

## 2021-06-27 DIAGNOSIS — M25562 Pain in left knee: Secondary | ICD-10-CM

## 2021-06-27 DIAGNOSIS — M25662 Stiffness of left knee, not elsewhere classified: Secondary | ICD-10-CM

## 2021-06-27 NOTE — Therapy (Signed)
[facility] Outpatient Rehabilitation [facility] [address] [place], [place], [address] Phone: [phone]   Fax:  [phone]  Physical Therapy Treatment  Patient Details  Name: Megan Bryan MRN: [id number] Date of Birth: Nov 27, 1980 Referring Provider (PT): Bjorn Pippin, MD   Encounter Date: 06/27/2021   PT End of Session - 06/27/21 0846     Visit Number 7    Number of Visits 24    Date for PT Re-Evaluation 08/30/21    Authorization Type [employer]    PT Start Time [phone]    PT Stop Time 0935    PT Time Calculation (min) 53 min             Past Medical History:  Diagnosis Date   BMI 27.0-27.9,adult     Past Surgical History:  Procedure Laterality Date   ANTERIOR CRUCIATE LIGAMENT REPAIR Left 06/05/2021   Procedure: ANTERIOR CRUCIATE LIGAMENT (ACL) REPAIR with patellar tendon graft;  Surgeon: Bjorn Pippin, MD;  Location: [facility];  Service: Orthopedics;  Laterality: Left;   KNEE ARTHROSCOPY WITH LATERAL MENISECTOMY Left 06/05/2021   Procedure: KNEE ARTHROSCOPY WITH LATERAL MENISECTOMY;  Surgeon: Bjorn Pippin, MD;  Location: [facility];  Service: Orthopedics;  Laterality: Left;   KNEE ARTHROSCOPY WITH MEDIAL MENISECTOMY Left 06/05/2021   Procedure: KNEE ARTHROSCOPY WITH MEDIAL MENISECTOMY;  Surgeon: Bjorn Pippin, MD;  Location: [facility];  Service: Orthopedics;  Laterality: Left;   TUBAL LIGATION      There were no vitals filed for this visit.   Subjective Assessment - 06/27/21 0844     Subjective I did my exercises this morning so the knee is a little shaky.    Currently in Pain? No/denies                [facility] PT Assessment - 06/27/21 0001       AROM   Left Knee Extension 5   -3 at end of session   Left Knee Flexion 101   103 at end session                          [facility] Adult PT Treatment/Exercise - 06/27/21 0001       Knee/Hip Exercises: Stretches    Active Hamstring Stretch 3 reps;30 seconds    Active Hamstring Stretch Limitations long sitting    Gastroc Stretch Limitations runners stretch left      Knee/Hip Exercises: Aerobic   Stationary Bike full revolutions x 5 minutes no resistance then with L1 resistance x 5 minutes   independent warm up prior to session.     Knee/Hip Exercises: Standing   Heel Raises 20 reps    Terminal Knee Extension 10 reps;2 sets    Theraband Level (Terminal Knee Extension) Level 3 (Green)    Hip Abduction 10 reps;2 sets;Right;Left    SLS 20 sec x 2 left      Knee/Hip Exercises: Seated   Sit to Sand 10 reps;2 sets   cues to shift weight to left , elevated mat table, 2nd set LLE back     Knee/Hip Exercises: Supine   Quad Sets Left;15 reps    Quad Sets Limitations with overpressure    Heel Slides AAROM    Straight Leg Raises 10 reps    Straight Leg Raises Limitations with initial quad set      Knee/Hip Exercises: Prone   Prone Knee Hang 3 minutes  Prone Knee Hang Weights (lbs) 3                       PT Short Term Goals - 06/07/21 0859       PT SHORT TERM GOAL #1   Title The patient will be independent in an initial ACLR home exercise program.    Baseline none    Time 2    Period Weeks    Status New    Target Date 06/28/21      PT SHORT TERM GOAL #2   Title The patient will present with improvement of left knee flexion active motion to 90 degrees for transfers and ambulation.    Baseline 55 deg    Time 4    Period Weeks    Status New    Target Date 07/05/21      PT SHORT TERM GOAL #3   Title The patient will be able to perform 30 reps of left SLR without an extensor lag for transition to waking without an AD.    Baseline 5 deg extensor lag    Time 4    Period Weeks    Status New    Target Date 07/05/21               PT Long Term Goals - 06/07/21 0927       PT LONG TERM GOAL #1   Title The patient will have an improved FOTO score to 69% or better for  return to premorbid activities.    Baseline 34%    Time 12    Period Weeks    Status New    Target Date 08/30/21      PT LONG TERM GOAL #2   Title The patient will be able to perform an eight inch step down with no compensation and good left knee eccentric control for stairs    Baseline step to pattern w/ crutches    Time 12    Period Weeks    Status New    Target Date 08/30/21      PT LONG TERM GOAL #3   Title The patient will be able to perform a single leg stance on foam x 30 second or greater for return to community work with childern.    Baseline unable to test    Time 12    Period Weeks    Status New    Target Date 08/30/21      PT LONG TERM GOAL #4   Title The patient will be able to squat to full depth with equal weight bearing for lifting.    Baseline unable to test    Time 12    Period Weeks    Status New    Target Date 08/30/21      PT LONG TERM GOAL #5   Title The patient will be able to complete a functonal hop test or Y balance testing and score a 90% or better for return to recreational and community activities.    Baseline not tested    Time 18    Period Weeks    Status New    Target Date 10/11/21                   Plan - 06/27/21 0903     Clinical Impression Statement Megan Bryan continues to improve her ROM measuring 101 after her rec bike  warmup today. Worked on knee extension ROM with weighted prone  hang, quad sets with overpressure and standing Terminal knee extension. Her Knee extension ROM improved from -5 to -3 after therex. increased closed chain therex today with good tolerance to SLS, able to hold >20 sec on left.    PT Treatment/Interventions ADLs/Self Care Home Management;Aquatic Therapy;Cryotherapy;Electrical Stimulation;Moist Heat;Gait training;Stair training;Functional mobility training;Therapeutic activities;Therapeutic exercise;Balance training;Neuromuscular re-education;Manual techniques;Patient/family education;Passive range  of motion;Dry needling;Joint Manipulations;Spinal Manipulations;Taping    PT Next Visit Plan review HEP, quadriceps strengthening, ROM per the protocol, hip strengthening, stretches, continue gait training, CKC strengthening, quad set with sLR (update HEP), standing TKE    PT Home Exercise Plan Access Code: [email or web address]             Patient will benefit from skilled therapeutic intervention in order to improve the following deficits and impairments:  Abnormal gait, Decreased range of motion, Difficulty walking, Pain, Decreased balance, Impaired flexibility, Improper body mechanics, Decreased mobility, Decreased strength, Increased edema  Visit Diagnosis: Acute pain of left knee  Muscle weakness (generalized)  Difficulty in walking, not elsewhere classified  Decreased range of motion of left knee     Problem List Patient Active Problem List   Diagnosis Date Noted   BMI 27.0-27.9,adult     Sherrie Mustache, PTA 06/27/2021, 12:14 PM  [facility] Outpatient Rehabilitation [facility] [address] [place], [place], [address] Phone: [phone]   Fax:  [phone]  Name: Megan Bryan MRN: [id number] Date of Birth: [birth year]

## 2021-07-02 ENCOUNTER — Other Ambulatory Visit: Payer: Self-pay

## 2021-07-02 ENCOUNTER — Encounter: Payer: Self-pay | Admitting: Physical Therapy

## 2021-07-02 ENCOUNTER — Ambulatory Visit: Payer: 59 | Admitting: Physical Therapy

## 2021-07-02 DIAGNOSIS — M25662 Stiffness of left knee, not elsewhere classified: Secondary | ICD-10-CM

## 2021-07-02 DIAGNOSIS — M25562 Pain in left knee: Secondary | ICD-10-CM | POA: Diagnosis not present

## 2021-07-02 DIAGNOSIS — R262 Difficulty in walking, not elsewhere classified: Secondary | ICD-10-CM

## 2021-07-02 DIAGNOSIS — M6281 Muscle weakness (generalized): Secondary | ICD-10-CM

## 2021-07-02 NOTE — Therapy (Signed)
[facility] Outpatient Rehabilitation [facility] [address] [place], [place], [address] Phone: [phone]   Fax:  [phone]  Physical Therapy Treatment  Patient Details  Name: Megan Bryan MRN: [id number] Date of Birth: 1981-08-24 Referring Provider (PT): Bjorn Pippin, MD   Encounter Date: 07/02/2021   PT End of Session - 07/02/21 0856     Visit Number 8    Number of Visits 24    Date for PT Re-Evaluation 08/30/21    Authorization Type [employer]    PT Start Time [phone]    PT Stop Time 0942    PT Time Calculation (min) 56 min             Past Medical History:  Diagnosis Date   BMI 27.0-27.9,adult     Past Surgical History:  Procedure Laterality Date   ANTERIOR CRUCIATE LIGAMENT REPAIR Left 06/05/2021   Procedure: ANTERIOR CRUCIATE LIGAMENT (ACL) REPAIR with patellar tendon graft;  Surgeon: Bjorn Pippin, MD;  Location: [facility];  Service: Orthopedics;  Laterality: Left;   KNEE ARTHROSCOPY WITH LATERAL MENISECTOMY Left 06/05/2021   Procedure: KNEE ARTHROSCOPY WITH LATERAL MENISECTOMY;  Surgeon: Bjorn Pippin, MD;  Location: [facility];  Service: Orthopedics;  Laterality: Left;   KNEE ARTHROSCOPY WITH MEDIAL MENISECTOMY Left 06/05/2021   Procedure: KNEE ARTHROSCOPY WITH MEDIAL MENISECTOMY;  Surgeon: Bjorn Pippin, MD;  Location: [facility];  Service: Orthopedics;  Laterality: Left;   TUBAL LIGATION      There were no vitals filed for this visit.   Subjective Assessment - 07/02/21 0853     Subjective No pain, just tight. Had Varkey follow up  on Friday  and he discontined brace and crutches.    Currently in Pain? No/denies                [facility] PT Assessment - 07/02/21 0001       AROM   Left Knee Extension 3   quad lag   Left Knee Flexion 102   107 at end of session                          [facility] Adult PT Treatment/Exercise - 07/02/21 0001       Knee/Hip  Exercises: Stretches   Active Hamstring Stretch 3 reps;30 seconds    Active Hamstring Stretch Limitations long sitting    Quad Stretch Limitations prone strap - 3 x 30 sec    Gastroc Stretch Limitations slant board stretch      Knee/Hip Exercises: Aerobic   Stationary Bike 5 minutes level 1      Knee/Hip Exercises: Standing   Heel Raises 10 reps    Heel Raises Limitations single leg , each    Terminal Knee Extension 10 reps;2 sets    Theraband Level (Terminal Knee Extension) Level 4 (Blue)    Forward Step Up 10 reps;Step Height: 4";Hand Hold: 1;15 reps    Wall Squat 10 reps   45 degrees   Wall Squat Limitations 10 sec    SLS on foam oval left 60 seconds without LOB    Rebounder L SLS from floor 10 tosses; then from foam oval:      Knee/Hip Exercises: Supine   Straight Leg Raises 10 reps    Straight Leg Raises Limitations with initial quad set      Knee/Hip Exercises: Prone   Prone Knee Hang 3 minutes    Prone  Knee Hang Weights (lbs) 3      Vasopneumatic   Number Minutes Vasopneumatic  10 minutes    Vasopnuematic Location  Knee    Vasopneumatic Pressure Medium    Vasopneumatic Temperature  34                       PT Short Term Goals - 06/07/21 0859       PT SHORT TERM GOAL #1   Title The patient will be independent in an initial ACLR home exercise program.    Baseline none    Time 2    Period Weeks    Status New    Target Date 06/28/21      PT SHORT TERM GOAL #2   Title The patient will present with improvement of left knee flexion active motion to 90 degrees for transfers and ambulation.    Baseline 55 deg    Time 4    Period Weeks    Status New    Target Date 07/05/21      PT SHORT TERM GOAL #3   Title The patient will be able to perform 30 reps of left SLR without an extensor lag for transition to waking without an AD.    Baseline 5 deg extensor lag    Time 4    Period Weeks    Status New    Target Date 07/05/21               PT  Long Term Goals - 06/07/21 0927       PT LONG TERM GOAL #1   Title The patient will have an improved FOTO score to 69% or better for return to premorbid activities.    Baseline 34%    Time 12    Period Weeks    Status New    Target Date 08/30/21      PT LONG TERM GOAL #2   Title The patient will be able to perform an eight inch step down with no compensation and good left knee eccentric control for stairs    Baseline step to pattern w/ crutches    Time 12    Period Weeks    Status New    Target Date 08/30/21      PT LONG TERM GOAL #3   Title The patient will be able to perform a single leg stance on foam x 30 second or greater for return to community work with childern.    Baseline unable to test    Time 12    Period Weeks    Status New    Target Date 08/30/21      PT LONG TERM GOAL #4   Title The patient will be able to squat to full depth with equal weight bearing for lifting.    Baseline unable to test    Time 12    Period Weeks    Status New    Target Date 08/30/21      PT LONG TERM GOAL #5   Title The patient will be able to complete a functonal hop test or Y balance testing and score a 90% or better for return to recreational and community activities.    Baseline not tested    Time 18    Period Weeks    Status New    Target Date 10/11/21                   Plan -  07/02/21 4825     Clinical Impression Statement Mrs Buda saw Surgeon Friday and he was pleased with her progress. He discontinued brace and crutches. She will be 4 weeks post-op tomorrow. She lacks full extension and exhibits min quad lag with SLR. Continued with knee extension ROM and quad activation focuing on resisted retro gait and standing resisted TKE. Continued with </= 45 degree  Began prone quad stretching and updated HEP. Her AROM at end of session was 107 knee flexion.    PT Treatment/Interventions ADLs/Self Care Home Management;Aquatic Therapy;Cryotherapy;Electrical  Stimulation;Moist Heat;Gait training;Stair training;Functional mobility training;Therapeutic activities;Therapeutic exercise;Balance training;Neuromuscular re-education;Manual techniques;Patient/family education;Passive range of motion;Dry needling;Joint Manipulations;Spinal Manipulations;Taping    PT Next Visit Plan review HEP, quadriceps strengthening, ROM per the protocol, hip strengthening, stretches, continue gait training, CKC strengthening, quad set with sLR (update HEP), standing TKE    PT Home Exercise Plan Access Code: [email or web address]             Patient will benefit from skilled therapeutic intervention in order to improve the following deficits and impairments:  Abnormal gait, Decreased range of motion, Difficulty walking, Pain, Decreased balance, Impaired flexibility, Improper body mechanics, Decreased mobility, Decreased strength, Increased edema  Visit Diagnosis: Acute pain of left knee  Difficulty in walking, not elsewhere classified  Decreased range of motion of left knee  Muscle weakness (generalized)     Problem List Patient Active Problem List   Diagnosis Date Noted   BMI 27.0-27.9,adult     Sherrie Mustache, PTA 07/02/2021, 9:52 AM  [facility] [address] [place], [place], [address] Phone: [phone]   Fax:  [phone]  Name: JERICHO CIESLIK MRN: [id number] Date of Birth: [birth year]

## 2021-07-04 ENCOUNTER — Encounter: Payer: Self-pay | Admitting: Physical Therapy

## 2021-07-04 ENCOUNTER — Ambulatory Visit: Payer: 59 | Admitting: Physical Therapy

## 2021-07-04 ENCOUNTER — Other Ambulatory Visit: Payer: Self-pay

## 2021-07-04 DIAGNOSIS — R262 Difficulty in walking, not elsewhere classified: Secondary | ICD-10-CM

## 2021-07-04 DIAGNOSIS — M25662 Stiffness of left knee, not elsewhere classified: Secondary | ICD-10-CM

## 2021-07-04 DIAGNOSIS — M6281 Muscle weakness (generalized): Secondary | ICD-10-CM

## 2021-07-04 DIAGNOSIS — M25562 Pain in left knee: Secondary | ICD-10-CM | POA: Diagnosis not present

## 2021-07-04 NOTE — Therapy (Signed)
[facility] Outpatient Rehabilitation [facility] [address] [place], [place], [address] Phone: [phone]   Fax:  [phone]  Physical Therapy Treatment  Patient Details  Name: LAURENA VALKO MRN: [id number] Date of Birth: 08/06/1981 Referring Provider (PT): Bjorn Pippin, MD   Encounter Date: 07/04/2021   PT End of Session - 07/04/21 0804     Visit Number 9    Number of Visits 24    Date for PT Re-Evaluation 08/30/21    Authorization Type [employer]    PT Start Time 0804    PT Stop Time 0853    PT Time Calculation (min) 49 min    Activity Tolerance Patient tolerated treatment well    Behavior During Therapy [facility] for tasks assessed/performed             Past Medical History:  Diagnosis Date   BMI 27.0-27.9,adult     Past Surgical History:  Procedure Laterality Date   ANTERIOR CRUCIATE LIGAMENT REPAIR Left 06/05/2021   Procedure: ANTERIOR CRUCIATE LIGAMENT (ACL) REPAIR with patellar tendon graft;  Surgeon: Bjorn Pippin, MD;  Location: [facility];  Service: Orthopedics;  Laterality: Left;   KNEE ARTHROSCOPY WITH LATERAL MENISECTOMY Left 06/05/2021   Procedure: KNEE ARTHROSCOPY WITH LATERAL MENISECTOMY;  Surgeon: Bjorn Pippin, MD;  Location: [facility];  Service: Orthopedics;  Laterality: Left;   KNEE ARTHROSCOPY WITH MEDIAL MENISECTOMY Left 06/05/2021   Procedure: KNEE ARTHROSCOPY WITH MEDIAL MENISECTOMY;  Surgeon: Bjorn Pippin, MD;  Location: [facility];  Service: Orthopedics;  Laterality: Left;   TUBAL LIGATION      There were no vitals filed for this visit.   Subjective Assessment - 07/04/21 0806     Subjective "No pain today, still stiffness in the knee. Continued trouble with sleeping."    Currently in Pain? No/denies    Pain Score 0-No pain    Pain Orientation Left    Pain Descriptors / Indicators Tightness    Pain Type Chronic pain    Pain Frequency Intermittent    Aggravating  Factors  just doing alot    Pain Relieving Factors rest, ice                [facility] PT Assessment - 07/04/21 0001       Assessment   Medical Diagnosis s/p L Knee scope lateral meniscectomy, ACL repair, Laeral meniscus repair SX 06/05/21    Referring Provider (PT) Bjorn Pippin, MD    Onset Date/Surgical Date 06/05/21      AROM   Left Knee Extension 5   3 following manu   Left Knee Flexion 104   110 following                       [facility] Adult PT Treatment/Exercise:  Therapeutic Exercise: Recumbent bike L1 x 6 min  lowering seat every 2 to promote knee flexion Hamstring PNF stretch contract/ relax 2 x 30  Thomas pos quad stretch PNF contract/ relax 2 x 30  Quad set 1 x 5 holding 10 sec Functional mini squat from freemotion 1 x 15 Standing hamstring curl 1 x 20  Standing hip abduction 2 x 20 Step up/ down with LLE only  Tactile cues to avoid extending hip backward to reduce  TKE with L lateral weight shift with GTB in popliteal space  Manual Therapy: Tibial external rotation mobs grade III to promote TKE PROM knee flexion blocking popliteal space with  manual overpressure   Neuromuscular re-ed: SLS using Rebounder with red ball 2x 15 with  SLS 1 x LLE with red physioball doing ABC's with UE LOB multiple times during  Therapeutic Activity: N/A  Self Care N/A  ITEMS NOT PERFORMED TODAY: Game ready  Resisted walking               PT Short Term Goals - 06/07/21 0859       PT SHORT TERM GOAL #1   Title The patient will be independent in an initial ACLR home exercise program.    Baseline none    Time 2    Period Weeks    Status New    Target Date 06/28/21      PT SHORT TERM GOAL #2   Title The patient will present with improvement of left knee flexion active motion to 90 degrees for transfers and ambulation.    Baseline 55 deg    Time 4    Period Weeks    Status New    Target Date 07/05/21      PT SHORT TERM GOAL #3   Title The  patient will be able to perform 30 reps of left SLR without an extensor lag for transition to waking without an AD.    Baseline 5 deg extensor lag    Time 4    Period Weeks    Status New    Target Date 07/05/21               PT Long Term Goals - 06/07/21 0927       PT LONG TERM GOAL #1   Title The patient will have an improved FOTO score to 69% or better for return to premorbid activities.    Baseline 34%    Time 12    Period Weeks    Status New    Target Date 08/30/21      PT LONG TERM GOAL #2   Title The patient will be able to perform an eight inch step down with no compensation and good left knee eccentric control for stairs    Baseline step to pattern w/ crutches    Time 12    Period Weeks    Status New    Target Date 08/30/21      PT LONG TERM GOAL #3   Title The patient will be able to perform a single leg stance on foam x 30 second or greater for return to community work with childern.    Baseline unable to test    Time 12    Period Weeks    Status New    Target Date 08/30/21      PT LONG TERM GOAL #4   Title The patient will be able to squat to full depth with equal weight bearing for lifting.    Baseline unable to test    Time 12    Period Weeks    Status New    Target Date 08/30/21      PT LONG TERM GOAL #5   Title The patient will be able to complete a functonal hop test or Y balance testing and score a 90% or better for return to recreational and community activities.    Baseline not tested    Time 18    Period Weeks    Status New    Target Date 10/11/21  Plan - 07/04/21 0855     Clinical Impression Statement pt arrives reporting no pain noting only stiffness inthe L knee. continued working on knee ROMbike gradually lowering seat, and tibial mobs to maximize end range extension.  Following manual her total arc was 3 -110 degrees. continued working on [employer] which she performed well with min cues for proper  form. She demonstrated moderate postural sway with SLS with UE pertubations with physioball. end of session she noted feeling looser and denied modalities.    Examination-Activity Limitations Bathing;Locomotion Level;Transfers;Bed Mobility;Caring for Others;Carry;Sleep;Dressing;Lift;Stand;Stairs;Squat    PT Treatment/Interventions ADLs/Self Care Home Management;Aquatic Therapy;Cryotherapy;Electrical Stimulation;Moist Heat;Gait training;Stair training;Functional mobility training;Therapeutic activities;Therapeutic exercise;Balance training;Neuromuscular re-education;Manual techniques;Patient/family education;Passive range of motion;Dry needling;Joint Manipulations;Spinal Manipulations;Taping    PT Next Visit Plan review HEP, quadriceps strengthening, 5th week protocol .ROM per the protocol, hip strengthening, stretches, continue gait training, CKC strengthening, quad set with sLR (update HEP), standing TKE    PT Home Exercise Plan Access Code: [email or web address]    Consulted and Agree with Plan of Care Patient             Patient will benefit from skilled therapeutic intervention in order to improve the following deficits and impairments:  Abnormal gait, Decreased range of motion, Difficulty walking, Pain, Decreased balance, Impaired flexibility, Improper body mechanics, Decreased mobility, Decreased strength, Increased edema  Visit Diagnosis: Acute pain of left knee  Difficulty in walking, not elsewhere classified  Decreased range of motion of left knee  Muscle weakness (generalized)     Problem List Patient Active Problem List   Diagnosis Date Noted   BMI 27.0-27.9,adult    Lulu Riding PT, DPT, LAT, ATC  07/04/21  9:00 AM     [facility] Outpatient Rehabilitation [facility] [address] [place], [place], [address] Phone: [phone]   Fax:  [phone]  Name: CHRISTYANN MANOLIS MRN: [id number] Date of Birth: 1981/02/05

## 2021-07-09 ENCOUNTER — Encounter: Payer: Self-pay | Admitting: Physical Therapy

## 2021-07-09 ENCOUNTER — Other Ambulatory Visit: Payer: Self-pay

## 2021-07-09 ENCOUNTER — Ambulatory Visit: Payer: 59 | Admitting: Physical Therapy

## 2021-07-09 DIAGNOSIS — M25662 Stiffness of left knee, not elsewhere classified: Secondary | ICD-10-CM

## 2021-07-09 DIAGNOSIS — M6281 Muscle weakness (generalized): Secondary | ICD-10-CM

## 2021-07-09 DIAGNOSIS — M25562 Pain in left knee: Secondary | ICD-10-CM

## 2021-07-09 DIAGNOSIS — R262 Difficulty in walking, not elsewhere classified: Secondary | ICD-10-CM

## 2021-07-09 NOTE — Therapy (Signed)
[facility] Outpatient Rehabilitation [facility] [address] [place], [place], [address] Phone: [phone]   Fax:  [phone]  Physical Therapy Treatment  Patient Details  Name: FINNLEY LEWIS MRN: [id number] Date of Birth: 1981-09-01 Referring Provider (PT): Bjorn Pippin, MD   Encounter Date: 07/09/2021   PT End of Session - 07/09/21 0800     Visit Number 10    Number of Visits 24    Date for PT Re-Evaluation 08/30/21    Authorization Type [employer]    PT Start Time 0800    PT Stop Time 0845    PT Time Calculation (min) 45 min    Activity Tolerance Patient tolerated treatment well    Behavior During Therapy [facility] for tasks assessed/performed             Past Medical History:  Diagnosis Date   BMI 27.0-27.9,adult     Past Surgical History:  Procedure Laterality Date   ANTERIOR CRUCIATE LIGAMENT REPAIR Left 06/05/2021   Procedure: ANTERIOR CRUCIATE LIGAMENT (ACL) REPAIR with patellar tendon graft;  Surgeon: Bjorn Pippin, MD;  Location: [facility];  Service: Orthopedics;  Laterality: Left;   KNEE ARTHROSCOPY WITH LATERAL MENISECTOMY Left 06/05/2021   Procedure: KNEE ARTHROSCOPY WITH LATERAL MENISECTOMY;  Surgeon: Bjorn Pippin, MD;  Location: [facility];  Service: Orthopedics;  Laterality: Left;   KNEE ARTHROSCOPY WITH MEDIAL MENISECTOMY Left 06/05/2021   Procedure: KNEE ARTHROSCOPY WITH MEDIAL MENISECTOMY;  Surgeon: Bjorn Pippin, MD;  Location: [facility];  Service: Orthopedics;  Laterality: Left;   TUBAL LIGATION      There were no vitals filed for this visit.   Subjective Assessment - 07/09/21 0802     Subjective " no pain today, I have been going to the gym doing the bike. I do notice some stiffness but it is improving."    Currently in Pain? No/denies    Pain Onset More than a month ago    Pain Frequency Intermittent                [facility] PT Assessment - 07/09/21 0001        Assessment   Medical Diagnosis s/p L Knee scope lateral meniscectomy, ACL repair, Laeral meniscus repair SX 06/05/21    Referring Provider (PT) Bjorn Pippin, MD      AROM   Left Knee Extension 3    Left Knee Flexion 106                      [facility] Adult PT Treatment/Exercise:  Therapeutic Exercise: Bike L3 x 6 min - lower seat every 2 min to promote knee flexion Hamstring stretch 2 x 30 PNF contract / relax Quad stretch in thomas pos 2 x 30 PNF contract / relax LLE SLR 1 x 30 Able to maintain knee extension Stool scoot 4 x 50 ft - using bil LE 2 x LLE only Leg press using LLE 2 x 12, 1 set with both, 1 set con bil/ ecc LLE 40# Heel raise on slant board 1 x 30 R Sidelying L hip abduction 1 x 10 , followed with Clockwise   Manual Therapy: Tibial external rotation mobs grade III to promote TKE Tibiofemoral  AP grade IV With pt flexing knee at rest to max available ROM  PROM knee flexion blocking popliteal space with manual overpressure   Neuromuscular re-ed: SLS 1 x LLE with red physioball doing ABC's with UE  Cues to hold ball out front vs overhead   Therapeutic Activity: N/A  Self Care: N/A  ITEMS NOT PERFORMED TODAY:                 PT Short Term Goals - 07/09/21 5916       PT SHORT TERM GOAL #1   Title The patient will be independent in an initial ACLR home exercise program.    Period Weeks    Status Achieved      PT SHORT TERM GOAL #2   Title The patient will present with improvement of left knee flexion active motion to 90 degrees for transfers and ambulation.    Status Achieved      PT SHORT TERM GOAL #3   Title The patient will be able to perform 30 reps of left SLR without an extensor lag for transition to waking without an AD.    Period Weeks    Status Achieved               PT Long Term Goals - 06/07/21 3846       PT LONG TERM GOAL #1   Title The patient will have an improved FOTO score to 69% or better for return  to premorbid activities.    Baseline 34%    Time 12    Period Weeks    Status New    Target Date 08/30/21      PT LONG TERM GOAL #2   Title The patient will be able to perform an eight inch step down with no compensation and good left knee eccentric control for stairs    Baseline step to pattern w/ crutches    Time 12    Period Weeks    Status New    Target Date 08/30/21      PT LONG TERM GOAL #3   Title The patient will be able to perform a single leg stance on foam x 30 second or greater for return to community work with childern.    Baseline unable to test    Time 12    Period Weeks    Status New    Target Date 08/30/21      PT LONG TERM GOAL #4   Title The patient will be able to squat to full depth with equal weight bearing for lifting.    Baseline unable to test    Time 12    Period Weeks    Status New    Target Date 08/30/21      PT LONG TERM GOAL #5   Title The patient will be able to complete a functonal hop test or Y balance testing and score a 90% or better for return to recreational and community activities.    Baseline not tested    Time 18    Period Weeks    Status New    Target Date 10/11/21                   Plan - 07/09/21 0839     Clinical Impression Statement Lasundra continues to make good progress and is officially 5 weeks post op on 9/21 and is improving mobility with knee ROM increaing total arc 3 - 106 before any manual was performed. Continued working gross hip/ knee strengthening staying with protocol limtations. She did very well with all exercises but did note fatigue but was able to complete all exercises. No report of pain during or following session.  PT Treatment/Interventions ADLs/Self Care Home Management;Aquatic Therapy;Cryotherapy;Electrical Stimulation;Moist Heat;Gait training;Stair training;Functional mobility training;Therapeutic activities;Therapeutic exercise;Balance training;Neuromuscular re-education;Manual  techniques;Patient/family education;Passive range of motion;Dry needling;Joint Manipulations;Spinal Manipulations;Taping    PT Next Visit Plan review HEP, quadriceps strengthening, 5th week protocol .ROM per the protocol, hip strengthening, stretches, continue gait training, CKC strengthening, quad set with sLR (update HEP), standing TKE    Consulted and Agree with Plan of Care Patient             Patient will benefit from skilled therapeutic intervention in order to improve the following deficits and impairments:  Abnormal gait, Decreased range of motion, Difficulty walking, Pain, Decreased balance, Impaired flexibility, Improper body mechanics, Decreased mobility, Decreased strength, Increased edema  Visit Diagnosis: Acute pain of left knee  Difficulty in walking, not elsewhere classified  Decreased range of motion of left knee  Muscle weakness (generalized)     Problem List Patient Active Problem List   Diagnosis Date Noted   BMI 27.0-27.9,adult     Lulu Riding, PT 07/09/2021, 8:47 AM  [facility] [address] [place], [place], [address] Phone: [phone]   Fax:  [phone]  Name: REIGHLYNN SWINEY MRN: [id number] Date of Birth: February 04, 1981

## 2021-07-11 ENCOUNTER — Other Ambulatory Visit: Payer: Self-pay

## 2021-07-11 ENCOUNTER — Ambulatory Visit: Payer: 59 | Admitting: Physical Therapy

## 2021-07-11 DIAGNOSIS — R262 Difficulty in walking, not elsewhere classified: Secondary | ICD-10-CM

## 2021-07-11 DIAGNOSIS — M6281 Muscle weakness (generalized): Secondary | ICD-10-CM

## 2021-07-11 DIAGNOSIS — M25562 Pain in left knee: Secondary | ICD-10-CM | POA: Diagnosis not present

## 2021-07-11 DIAGNOSIS — M25662 Stiffness of left knee, not elsewhere classified: Secondary | ICD-10-CM

## 2021-07-11 NOTE — Therapy (Signed)
[facility] Outpatient Rehabilitation [facility] [address] [place], [place], [address] Phone: [phone]   Fax:  [phone]  Physical Therapy Treatment  Patient Details  Name: Megan Bryan MRN: [id number] Date of Birth: [birth year] Referring Provider (PT): Bjorn Pippin, MD   Encounter Date: 07/11/2021   PT End of Session - 07/11/21 0804     Visit Number 11    Number of Visits 24    Date for PT Re-Evaluation 08/30/21    Authorization Type [employer]    PT Start Time 0802    PT Stop Time 0846    PT Time Calculation (min) 44 min    Activity Tolerance Patient tolerated treatment well    Behavior During Therapy [facility] for tasks assessed/performed             Past Medical History:  Diagnosis Date   BMI 27.0-27.9,adult     Past Surgical History:  Procedure Laterality Date   ANTERIOR CRUCIATE LIGAMENT REPAIR Left 06/05/2021   Procedure: ANTERIOR CRUCIATE LIGAMENT (ACL) REPAIR with patellar tendon graft;  Surgeon: Bjorn Pippin, MD;  Location: [facility];  Service: Orthopedics;  Laterality: Left;   KNEE ARTHROSCOPY WITH LATERAL MENISECTOMY Left 06/05/2021   Procedure: KNEE ARTHROSCOPY WITH LATERAL MENISECTOMY;  Surgeon: Bjorn Pippin, MD;  Location: [facility];  Service: Orthopedics;  Laterality: Left;   KNEE ARTHROSCOPY WITH MEDIAL MENISECTOMY Left 06/05/2021   Procedure: KNEE ARTHROSCOPY WITH MEDIAL MENISECTOMY;  Surgeon: Bjorn Pippin, MD;  Location: [facility];  Service: Orthopedics;  Laterality: Left;   TUBAL LIGATION      There were no vitals filed for this visit.   Subjective Assessment - 07/11/21 0805     Subjective "I am doing pretty good, some discomfort with sleeping, more like it tightens up."    Patient Stated Goals To get back to being active, running, and playing basketball.    Currently in Pain? No/denies                [facility] PT Assessment - 07/11/21 0001       Assessment    Medical Diagnosis s/p L Knee scope lateral meniscectomy, ACL repair, Laeral meniscus repair SX 06/05/21      AROM   Left Knee Extension 0    Left Knee Flexion 103   following manual 120                [facility] Adult PT Treatment/Exercise:  Therapeutic Exercise: Recumbent bike L3 x 6 min dropping seat every 2 min for knee flexion Quad stretch PNF contract relax 2 x 30 Hamstring curl seated 2 x 15 25#, 2nd set con bil/ ecc LLE only Leg press LLE only 2 x 15 25# Lateral band walks 2 x 15 bil with RTB around ankles Monster walks 2 x 15 bil with RTB around the ankles  Manual Therapy: Tibiofemoral mobs grade IV, AP with active flexion to increase ROM intermittently Knee flexion with towel blocking popliteal space with manual over pressure   Neuromuscular re-ed: SLS  using rebounder tossing yellow ball while standing on airex [place] 2 x 15 Tandem 1 x 10 before switching feet SLS 1 x 10 LLE  Therapeutic Activity: N/A  Self Care: N/A  ITEMS NOT PERFORMED TODAY:                    PT Education - 07/11/21 0850     Education Details updated HEP to include knee  flexion with self mob    Person(s) Educated Patient    Methods Explanation;Verbal cues    Comprehension Verbalized understanding;Verbal cues required              PT Short Term Goals - 07/09/21 2355       PT SHORT TERM GOAL #1   Title The patient will be independent in an initial ACLR home exercise program.    Period Weeks    Status Achieved      PT SHORT TERM GOAL #2   Title The patient will present with improvement of left knee flexion active motion to 90 degrees for transfers and ambulation.    Status Achieved      PT SHORT TERM GOAL #3   Title The patient will be able to perform 30 reps of left SLR without an extensor lag for transition to waking without an AD.    Period Weeks    Status Achieved               PT Long Term Goals - 06/07/21 7322       PT LONG TERM GOAL #1    Title The patient will have an improved FOTO score to 69% or better for return to premorbid activities.    Baseline 34%    Time 12    Period Weeks    Status New    Target Date 08/30/21      PT LONG TERM GOAL #2   Title The patient will be able to perform an eight inch step down with no compensation and good left knee eccentric control for stairs    Baseline step to pattern w/ crutches    Time 12    Period Weeks    Status New    Target Date 08/30/21      PT LONG TERM GOAL #3   Title The patient will be able to perform a single leg stance on foam x 30 second or greater for return to community work with childern.    Baseline unable to test    Time 12    Period Weeks    Status New    Target Date 08/30/21      PT LONG TERM GOAL #4   Title The patient will be able to squat to full depth with equal weight bearing for lifting.    Baseline unable to test    Time 12    Period Weeks    Status New    Target Date 08/30/21      PT LONG TERM GOAL #5   Title The patient will be able to complete a functonal hop test or Y balance testing and score a 90% or better for return to recreational and community activities.    Baseline not tested    Time 18    Period Weeks    Status New    Target Date 10/11/21                   Plan - 07/11/21 0848     Clinical Impression Statement pt arrives to sesson with knee ext at 0 degrees and flexoin 103 which following manual increased to 120. continued working on hamstring activaiton to [employer] knee ROM. she was able to complete all strengthening exercises, and demonstrated improved balacne on airex pad with pertubations varying her BOS.             Patient will benefit from skilled therapeutic intervention in order to improve the  following deficits and impairments:     Visit Diagnosis: Acute pain of left knee  Decreased range of motion of left knee  Difficulty in walking, not elsewhere classified  Muscle weakness  (generalized)     Problem List Patient Active Problem List   Diagnosis Date Noted   BMI 27.0-27.9,adult    Lulu Riding PT, DPT, LAT, ATC  07/11/21  8:53 AM      [facility] [address] [place], [place], [address] Phone: [phone]   Fax:  [phone]  Name: ANNAH JASKO MRN: [id number] Date of Birth: 1980-11-12

## 2021-07-16 ENCOUNTER — Ambulatory Visit: Payer: 59 | Admitting: Physical Therapy

## 2021-07-16 ENCOUNTER — Other Ambulatory Visit: Payer: Self-pay

## 2021-07-16 ENCOUNTER — Encounter: Payer: Self-pay | Admitting: Physical Therapy

## 2021-07-16 DIAGNOSIS — M25562 Pain in left knee: Secondary | ICD-10-CM

## 2021-07-16 DIAGNOSIS — M6281 Muscle weakness (generalized): Secondary | ICD-10-CM

## 2021-07-16 DIAGNOSIS — R262 Difficulty in walking, not elsewhere classified: Secondary | ICD-10-CM

## 2021-07-16 DIAGNOSIS — M25662 Stiffness of left knee, not elsewhere classified: Secondary | ICD-10-CM

## 2021-07-16 NOTE — Therapy (Signed)
[facility] [address] [place], [place], [address] Phone: [phone]   Fax:  [phone]  Physical Therapy Treatment  Patient Details  Name: Megan Bryan MRN: [id number] Date of Birth: 01-04-1981 Referring Provider (PT): Bjorn Pippin, MD   Encounter Date: 07/16/2021   PT End of Session - 07/16/21 0809     Visit Number 12    Number of Visits 24    Date for PT Re-Evaluation 08/30/21    Authorization Type [employer]    PT Start Time 0808   pt arrived late   PT Stop Time 0846    PT Time Calculation (min) 38 min    Activity Tolerance Patient tolerated treatment well    Behavior During Therapy [facility] for tasks assessed/performed             Past Medical History:  Diagnosis Date   BMI 27.0-27.9,adult     Past Surgical History:  Procedure Laterality Date   ANTERIOR CRUCIATE LIGAMENT REPAIR Left 06/05/2021   Procedure: ANTERIOR CRUCIATE LIGAMENT (ACL) REPAIR with patellar tendon graft;  Surgeon: Bjorn Pippin, MD;  Location: [facility];  Service: Orthopedics;  Laterality: Left;   KNEE ARTHROSCOPY WITH LATERAL MENISECTOMY Left 06/05/2021   Procedure: KNEE ARTHROSCOPY WITH LATERAL MENISECTOMY;  Surgeon: Bjorn Pippin, MD;  Location: [facility];  Service: Orthopedics;  Laterality: Left;   KNEE ARTHROSCOPY WITH MEDIAL MENISECTOMY Left 06/05/2021   Procedure: KNEE ARTHROSCOPY WITH MEDIAL MENISECTOMY;  Surgeon: Bjorn Pippin, MD;  Location: [facility];  Service: Orthopedics;  Laterality: Left;   TUBAL LIGATION      There were no vitals filed for this visit.   Subjective Assessment - 07/16/21 0809     Subjective " I am doing pretty good overall. I did alot of walking/ standing recently and noticed more swellling in the knee. no pain just stiffness. "    Patient Stated Goals To get back to being active, running, and playing basketball.    Currently in Pain? No/denies                 [facility] PT Assessment - 07/16/21 0001       Assessment   Medical Diagnosis s/p L Knee scope lateral meniscectomy, ACL repair, Laeral meniscus repair SX 06/05/21    Referring Provider (PT) Bjorn Pippin, MD      AROM   Left Knee Flexion 103   increased to 117 following                    [facility] Adult PT Treatment/Exercise:  Therapeutic Exercise: Recumbent bike L3 x 6 min , lowering seat every 2 min for knee flexion Gastroc stretch 1 x 30 via slant board Hamstring stretch 2 x 30 with strap in supine Quad stretch 2 x 30 in prone pos with strap Bridge 1 x 15 with RLE advanced forward to promote LLE activation  Bridge 2 x 15 with feet on black foam roller for hamstring activation Tall plank 5 x 20 sec Forward lunge touching back leg down into bosu 2 x 10 bil  Manual Therapy:  Tibialfemoral AP mobs grade IV, with active knee flexion intermittent   Neuromuscular re-ed: SLS 1 x 10 perturbations, 2 x 10 perturbations while standing on green therapad  Therapeutic Activity: N/A  Self Care: N/A  ITEMS NOT PERFORMED TODAY:  PT Short Term Goals - 07/09/21 7096       PT SHORT TERM GOAL #1   Title The patient will be independent in an initial ACLR home exercise program.    Period Weeks    Status Achieved      PT SHORT TERM GOAL #2   Title The patient will present with improvement of left knee flexion active motion to 90 degrees for transfers and ambulation.    Status Achieved      PT SHORT TERM GOAL #3   Title The patient will be able to perform 30 reps of left SLR without an extensor lag for transition to waking without an AD.    Period Weeks    Status Achieved               PT Long Term Goals - 06/07/21 2836       PT LONG TERM GOAL #1   Title The patient will have an improved FOTO score to 69% or better for return to premorbid activities.    Baseline 34%    Time 12    Period Weeks    Status New    Target  Date 08/30/21      PT LONG TERM GOAL #2   Title The patient will be able to perform an eight inch step down with no compensation and good left knee eccentric control for stairs    Baseline step to pattern w/ crutches    Time 12    Period Weeks    Status New    Target Date 08/30/21      PT LONG TERM GOAL #3   Title The patient will be able to perform a single leg stance on foam x 30 second or greater for return to community work with childern.    Baseline unable to test    Time 12    Period Weeks    Status New    Target Date 08/30/21      PT LONG TERM GOAL #4   Title The patient will be able to squat to full depth with equal weight bearing for lifting.    Baseline unable to test    Time 12    Period Weeks    Status New    Target Date 08/30/21      PT LONG TERM GOAL #5   Title The patient will be able to complete a functonal hop test or Y balance testing and score a 90% or better for return to recreational and community activities.    Baseline not tested    Time 18    Period Weeks    Status New    Target Date 10/11/21                   Plan - 07/16/21 6294     Clinical Impression Statement mrs Cedillo reports increased stiffness in the knee today which she states is related to doing more walking. Continued focusing on knee flexion which she initially measured 103 today but increased to 117 following manual. continued working on hip/ knee strengthening with incre which she did well with. continued working on SLS balance with pertubations.    PT Treatment/Interventions ADLs/Self Care Home Management;Aquatic Therapy;Cryotherapy;Electrical Stimulation;Moist Heat;Gait training;Stair training;Functional mobility training;Therapeutic activities;Therapeutic exercise;Balance training;Neuromuscular re-education;Manual techniques;Patient/family education;Passive range of motion;Dry needling;Joint Manipulations;Spinal Manipulations;Taping    PT Next Visit Plan review HEP,  quadriceps strengthening, 6th week protocol . hip strengthening, stretches, continue gait training, CKC strengthening, quad set  with sLR (update HEP), progress hip strength    PT Home Exercise Plan Access Code: [email or web address]    Consulted and Agree with Plan of Care Patient             Patient will benefit from skilled therapeutic intervention in order to improve the following deficits and impairments:  Abnormal gait, Decreased range of motion, Difficulty walking, Pain, Decreased balance, Impaired flexibility, Improper body mechanics, Decreased mobility, Decreased strength, Increased edema  Visit Diagnosis: No diagnosis found.     Problem List Patient Active Problem List   Diagnosis Date Noted   BMI 27.0-27.9,adult    Lulu Riding PT, DPT, LAT, ATC  07/16/21  8:47 AM      [facility] [address] [place], [place], [address] Phone: [phone]   Fax:  [phone]  Name: EMILIE CARP MRN: [id number] Date of Birth: 03-25-1981

## 2021-07-18 ENCOUNTER — Other Ambulatory Visit: Payer: Self-pay

## 2021-07-18 ENCOUNTER — Encounter: Payer: 59 | Admitting: Physical Therapy

## 2021-07-18 ENCOUNTER — Ambulatory Visit: Payer: 59 | Admitting: Physical Therapy

## 2021-07-18 DIAGNOSIS — M6281 Muscle weakness (generalized): Secondary | ICD-10-CM

## 2021-07-18 DIAGNOSIS — R262 Difficulty in walking, not elsewhere classified: Secondary | ICD-10-CM

## 2021-07-18 DIAGNOSIS — M25662 Stiffness of left knee, not elsewhere classified: Secondary | ICD-10-CM

## 2021-07-18 DIAGNOSIS — M25562 Pain in left knee: Secondary | ICD-10-CM

## 2021-07-18 NOTE — Therapy (Signed)
[facility] [address] [place], [place], [address] Phone: [phone]   Fax:  [phone]  Physical Therapy Treatment  Patient Details  Name: Megan Bryan MRN: [id number] Date of Birth: 18-Jan-1981 Referring Provider (PT): Bjorn Pippin, MD   Encounter Date: 07/18/2021   PT End of Session - 07/18/21 0805     Visit Number 13    Number of Visits 24    Date for PT Re-Evaluation 08/30/21    Authorization Type [employer]    PT Start Time 0805    PT Stop Time 0846    PT Time Calculation (min) 41 min    Activity Tolerance Patient tolerated treatment well    Behavior During Therapy [facility] for tasks assessed/performed             Past Medical History:  Diagnosis Date   BMI 27.0-27.9,adult     Past Surgical History:  Procedure Laterality Date   ANTERIOR CRUCIATE LIGAMENT REPAIR Left 06/05/2021   Procedure: ANTERIOR CRUCIATE LIGAMENT (ACL) REPAIR with patellar tendon graft;  Surgeon: Bjorn Pippin, MD;  Location: [facility];  Service: Orthopedics;  Laterality: Left;   KNEE ARTHROSCOPY WITH LATERAL MENISECTOMY Left 06/05/2021   Procedure: KNEE ARTHROSCOPY WITH LATERAL MENISECTOMY;  Surgeon: Bjorn Pippin, MD;  Location: [facility];  Service: Orthopedics;  Laterality: Left;   KNEE ARTHROSCOPY WITH MEDIAL MENISECTOMY Left 06/05/2021   Procedure: KNEE ARTHROSCOPY WITH MEDIAL MENISECTOMY;  Surgeon: Bjorn Pippin, MD;  Location: [facility];  Service: Orthopedics;  Laterality: Left;   TUBAL LIGATION      There were no vitals filed for this visit.   Subjective Assessment - 07/18/21 0806     Subjective "doing pretty good. still doing execises at home, no pain."    Patient Stated Goals To get back to being active, running, and playing basketball.    Currently in Pain? No/denies                [facility] PT Assessment - 07/18/21 0001       AROM   Left Knee Flexion 106    increased to 117 following exercise today.                         [facility] Adult PT Treatment/Exercise:  Therapeutic Exercise: Recumbent bike L 3 x 5 min - lowering seat at 2:30 Quad stretch 2 x 30 in thomas pos with strap Bridge 1 x 15 with heels on black foam roller Seated hamstring curl 1 x 10 BTB Hamstring curl wit ball in popliteal space 1 x 10 squeezing ball x 2 sec ea. Lunge 2 x 10 with toes against the wall to reduce compensations Lateral lunge 2 x 10 bil - cues for proper form to allow for knee flexion Sustained bridge with hamstring curl with calves on red physioball 2 x 10  Manual Therapy:  DTM / rolling over quad during quad stretch   Neuromuscular re-ed: Step up on bosu 1 x 20 with bil - intermittent HHA for stability   Therapeutic Activity: N/A  Modalities: N/A  Self Care: N/A  Consider / progression for next session:           PT Education - 07/18/21 0851     Education Details updated HEP for hamstring curl and lunges 2 x 10 ea. unable to update medbridge due to system being down    [employer]) Educated Patient  Methods Explanation;Verbal cues;Handout    Comprehension Verbalized understanding;Verbal cues required              PT Short Term Goals - 07/09/21 9892       PT SHORT TERM GOAL #1   Title The patient will be independent in an initial ACLR home exercise program.    Period Weeks    Status Achieved      PT SHORT TERM GOAL #2   Title The patient will present with improvement of left knee flexion active motion to 90 degrees for transfers and ambulation.    Status Achieved      PT SHORT TERM GOAL #3   Title The patient will be able to perform 30 reps of left SLR without an extensor lag for transition to waking without an AD.    Period Weeks    Status Achieved               PT Long Term Goals - 06/07/21 1194       PT LONG TERM GOAL #1   Title The patient will have an improved FOTO score to 69% or better  for return to premorbid activities.    Baseline 34%    Time 12    Period Weeks    Status New    Target Date 08/30/21      PT LONG TERM GOAL #2   Title The patient will be able to perform an eight inch step down with no compensation and good left knee eccentric control for stairs    Baseline step to pattern w/ crutches    Time 12    Period Weeks    Status New    Target Date 08/30/21      PT LONG TERM GOAL #3   Title The patient will be able to perform a single leg stance on foam x 30 second or greater for return to community work with childern.    Baseline unable to test    Time 12    Period Weeks    Status New    Target Date 08/30/21      PT LONG TERM GOAL #4   Title The patient will be able to squat to full depth with equal weight bearing for lifting.    Baseline unable to test    Time 12    Period Weeks    Status New    Target Date 08/30/21      PT LONG TERM GOAL #5   Title The patient will be able to complete a functonal hop test or Y balance testing and score a 90% or better for return to recreational and community activities.    Baseline not tested    Time 18    Period Weeks    Status New    Target Date 10/11/21                   Plan - 07/18/21 0847     Clinical Impression Statement pt arrives with 106 degrees of flexion today which following exercise with focus on hamstring activation and quad stretch she improved to 117 degrees today. began 7 weeks strengthening per protocol allowance. She does well with CKC strengthening with lunges but does demonstated limited knee flexion with no report of pain or issues. Reviewed HEP and updated for seated hamstring curl, and lunges.    PT Treatment/Interventions ADLs/Self Care Home Management;Aquatic Therapy;Cryotherapy;Electrical Stimulation;Moist Heat;Gait training;Stair training;Functional mobility training;Therapeutic activities;Therapeutic exercise;Balance training;Neuromuscular re-education;Manual  techniques;Patient/family  education;Passive range of motion;Dry needling;Joint Manipulations;Spinal Manipulations;Taping    PT Next Visit Plan review HEP, quadriceps strengthening, 7th week protocol . hip strengthening, stretches, continue gait training, CKC strengthening, quad set with sLR (update HEP), progress hip strength    PT Home Exercise Plan Access Code: [email or web address]    Consulted and Agree with Plan of Care Patient             Patient will benefit from skilled therapeutic intervention in order to improve the following deficits and impairments:  Abnormal gait, Decreased range of motion, Difficulty walking, Pain, Decreased balance, Impaired flexibility, Improper body mechanics, Decreased mobility, Decreased strength, Increased edema  Visit Diagnosis: Acute pain of left knee  Decreased range of motion of left knee  Difficulty in walking, not elsewhere classified  Muscle weakness (generalized)     Problem List Patient Active Problem List   Diagnosis Date Noted   BMI 27.0-27.9,adult    Lulu Riding PT, DPT, LAT, ATC  07/18/21  8:52 AM     [facility] [address] [place], [place], [address] Phone: [phone]   Fax:  [phone]  Name: Megan Bryan MRN: [id number] Date of Birth: 28-Jan-1981

## 2021-07-23 ENCOUNTER — Ambulatory Visit: Payer: 59 | Attending: Orthopaedic Surgery | Admitting: Physical Therapy

## 2021-07-23 ENCOUNTER — Encounter: Payer: Self-pay | Admitting: Physical Therapy

## 2021-07-23 ENCOUNTER — Other Ambulatory Visit: Payer: Self-pay

## 2021-07-23 DIAGNOSIS — M25562 Pain in left knee: Secondary | ICD-10-CM | POA: Diagnosis present

## 2021-07-23 DIAGNOSIS — M6281 Muscle weakness (generalized): Secondary | ICD-10-CM | POA: Diagnosis present

## 2021-07-23 DIAGNOSIS — M25662 Stiffness of left knee, not elsewhere classified: Secondary | ICD-10-CM

## 2021-07-23 DIAGNOSIS — R262 Difficulty in walking, not elsewhere classified: Secondary | ICD-10-CM | POA: Diagnosis present

## 2021-07-23 NOTE — Therapy (Signed)
[facility] [place] [facility] [address] [place], [place], [address] Phone: [phone]   Fax:  [phone]  Physical Therapy Treatment  Patient Details  Name: Megan Bryan MRN: [id number] Date of Birth: 09-18-1981 Referring Provider (PT): Bjorn Pippin, MD   Encounter Date: 07/23/2021   PT End of Session - 07/23/21 0841     Visit Number 14    Number of Visits 24    Date for PT Re-Evaluation 08/30/21    Authorization Type [employer]    PT Start Time 0800    PT Stop Time 0843    PT Time Calculation (min) 43 min             Past Medical History:  Diagnosis Date   BMI 27.0-27.9,adult     Past Surgical History:  Procedure Laterality Date   ANTERIOR CRUCIATE LIGAMENT REPAIR Left 06/05/2021   Procedure: ANTERIOR CRUCIATE LIGAMENT (ACL) REPAIR with patellar tendon graft;  Surgeon: Bjorn Pippin, MD;  Location: [facility];  Service: Orthopedics;  Laterality: Left;   KNEE ARTHROSCOPY WITH LATERAL MENISECTOMY Left 06/05/2021   Procedure: KNEE ARTHROSCOPY WITH LATERAL MENISECTOMY;  Surgeon: Bjorn Pippin, MD;  Location: [facility];  Service: Orthopedics;  Laterality: Left;   KNEE ARTHROSCOPY WITH MEDIAL MENISECTOMY Left 06/05/2021   Procedure: KNEE ARTHROSCOPY WITH MEDIAL MENISECTOMY;  Surgeon: Bjorn Pippin, MD;  Location: [facility];  Service: Orthopedics;  Laterality: Left;   TUBAL LIGATION      There were no vitals filed for this visit.   Subjective Assessment - 07/23/21 1051     Subjective Doing well, no pain. I am doing some of the exercises we have been doing here like lunges.    Currently in Pain? No/denies                [facility] PT Assessment - 07/23/21 0001       AROM   Left Knee Flexion 112            [facility] Adult PT Treatment/Exercise:   Therapeutic Exercise: Recumbent bike L 3 x 5 min - lowering seat at 2:30 Quad stretch 2 x 30 iprone with strap Hamstring  stretch supine with strap 2 x 30 sec  Bridge 2 x 10 with heels on black foam roller Knee flexion 15# bilateral and then with single leg eccentric 10 x 2  Lunge 2 x 10 with toes against the wall to reduce compensations Lateral lunge 2 x 10 bil - cues for proper form to allow for knee flexion   PT Short Term Goals - 07/09/21 9622       PT SHORT TERM GOAL #1   Title The patient will be independent in an initial ACLR home exercise program.    Period Weeks    Status Achieved      PT SHORT TERM GOAL #2   Title The patient will present with improvement of left knee flexion active motion to 90 degrees for transfers and ambulation.    Status Achieved      PT SHORT TERM GOAL #3   Title The patient will be able to perform 30 reps of left SLR without an extensor lag for transition to waking without an AD.    Period Weeks    Status Achieved               PT Long Term Goals - 06/07/21 0927       PT LONG TERM  GOAL #1   Title The patient will have an improved FOTO score to 69% or better for return to premorbid activities.    Baseline 34%    Time 12    Period Weeks    Status New    Target Date 08/30/21      PT LONG TERM GOAL #2   Title The patient will be able to perform an eight inch step down with no compensation and good left knee eccentric control for stairs    Baseline step to pattern w/ crutches    Time 12    Period Weeks    Status New    Target Date 08/30/21      PT LONG TERM GOAL #3   Title The patient will be able to perform a single leg stance on foam x 30 second or greater for return to community work with childern.    Baseline unable to test    Time 12    Period Weeks    Status New    Target Date 08/30/21      PT LONG TERM GOAL #4   Title The patient will be able to squat to full depth with equal weight bearing for lifting.    Baseline unable to test    Time 12    Period Weeks    Status New    Target Date 08/30/21      PT LONG TERM GOAL #5   Title The  patient will be able to complete a functonal hop test or Y balance testing and score a 90% or better for return to recreational and community activities.    Baseline not tested    Time 18    Period Weeks    Status New    Target Date 10/11/21                   Plan - 07/23/21 1052     Clinical Impression Statement Pt arrives and reports no pain. Began Rec bike and continued with strengthening focus. Her ROM at end of sesson was 112 degrees flexion. She was able to complete 20/25 single heel raises on the surgical LE. She is progressing well. She reports completing additional exercises at home that have not formally been added to her HEP. Will look at HEP and streamline/ update with closed chain exercises as appropriate.    PT Treatment/Interventions ADLs/Self Care Home Management;Aquatic Therapy;Cryotherapy;Electrical Stimulation;Moist Heat;Gait training;Stair training;Functional mobility training;Therapeutic activities;Therapeutic exercise;Balance training;Neuromuscular re-education;Manual techniques;Patient/family education;Passive range of motion;Dry needling;Joint Manipulations;Spinal Manipulations;Taping    PT Next Visit Plan Streamline  HEP with closed chain as appropriate , quadriceps strengthening, 7th week protocol . hip strengthening, stretches, continue gait training, CKC strengthening, quad set with sLR (update HEP), progress hip strength    PT Home Exercise Plan Access Code: [email or web address]             Patient will benefit from skilled therapeutic intervention in order to improve the following deficits and impairments:  Abnormal gait, Decreased range of motion, Difficulty walking, Pain, Decreased balance, Impaired flexibility, Improper body mechanics, Decreased mobility, Decreased strength, Increased edema  Visit Diagnosis: Decreased range of motion of left knee  Acute pain of left knee  Difficulty in walking, not elsewhere classified  Muscle weakness  (generalized)     Problem List Patient Active Problem List   Diagnosis Date Noted   BMI 27.0-27.9,adult     Sherrie Mustache, PTA 07/23/2021, 10:55 AM  [facility] [address]  [address] [place], [place], [address] Phone: [phone]   Fax:  [phone]  Name: Megan Bryan MRN: [id number] Date of Birth: 1981/08/18

## 2021-07-25 ENCOUNTER — Ambulatory Visit: Payer: 59 | Admitting: Physical Therapy

## 2021-07-25 ENCOUNTER — Encounter: Payer: Self-pay | Admitting: Physical Therapy

## 2021-07-25 ENCOUNTER — Other Ambulatory Visit: Payer: Self-pay

## 2021-07-25 DIAGNOSIS — R262 Difficulty in walking, not elsewhere classified: Secondary | ICD-10-CM

## 2021-07-25 DIAGNOSIS — M25662 Stiffness of left knee, not elsewhere classified: Secondary | ICD-10-CM | POA: Diagnosis not present

## 2021-07-25 DIAGNOSIS — M25562 Pain in left knee: Secondary | ICD-10-CM

## 2021-07-25 DIAGNOSIS — M6281 Muscle weakness (generalized): Secondary | ICD-10-CM

## 2021-07-25 NOTE — Therapy (Signed)
[facility] [address] [place], [place], [address] Phone: [phone]   Fax:  [phone]  Physical Therapy Treatment  Patient Details  Name: Megan Bryan MRN: [id number] Date of Birth: 1981-01-30 Referring Provider (PT): Bjorn Pippin, MD   Encounter Date: 07/25/2021   PT End of Session - 07/25/21 0804     Visit Number 15    Number of Visits 24    Date for PT Re-Evaluation 08/30/21    Authorization Type [employer]    PT Start Time 0802    PT Stop Time 0848    PT Time Calculation (min) 46 min    Activity Tolerance Patient tolerated treatment well    Behavior During Therapy [facility] for tasks assessed/performed             Past Medical History:  Diagnosis Date   BMI 27.0-27.9,adult     Past Surgical History:  Procedure Laterality Date   ANTERIOR CRUCIATE LIGAMENT REPAIR Left 06/05/2021   Procedure: ANTERIOR CRUCIATE LIGAMENT (ACL) REPAIR with patellar tendon graft;  Surgeon: Bjorn Pippin, MD;  Location: [facility];  Service: Orthopedics;  Laterality: Left;   KNEE ARTHROSCOPY WITH LATERAL MENISECTOMY Left 06/05/2021   Procedure: KNEE ARTHROSCOPY WITH LATERAL MENISECTOMY;  Surgeon: Bjorn Pippin, MD;  Location: [facility];  Service: Orthopedics;  Laterality: Left;   KNEE ARTHROSCOPY WITH MEDIAL MENISECTOMY Left 06/05/2021   Procedure: KNEE ARTHROSCOPY WITH MEDIAL MENISECTOMY;  Surgeon: Bjorn Pippin, MD;  Location: [facility];  Service: Orthopedics;  Laterality: Left;   TUBAL LIGATION      There were no vitals filed for this visit.   Subjective Assessment - 07/25/21 0805     Subjective "I am doing ok, no pain. I was disappointed in my knee bending last session."                [facility] PT Assessment - 07/25/21 0001       AROM   Left Knee Flexion 115                          [facility] Adult PT Treatment/Exercise:  Therapeutic  Exercise: Recumbent bike L3 x 6 min starting seat at lowest position Low load long duration knee flexion - Supine with feet on the wall with 5 # weight x 5 min  Lateral band walks 2 x 20 with GTB bil Lunge 1 x 10 bil  Using tactile cue for the L knee to touch down onto to promote knee flexion  Kettlebell goblet squat 2 x 10 touching elbows to knees Seated hamstring curl 1 x 15 with GTB Min cuing to place L hand on the table and lean to the L to reduce compensation  Manual Therapy:  Tibiofemoral mobs grade IV with active flexion intermittently   Neuromuscular re-ed: Single leg stance: 5 x with eyes closed on ground best time was 15 seconds Trialed SLS on green therapad x 5 with EO best time was 12 seconds  Therapeutic Activity: N/A  Modalities: N/A  Self Care: N/A  Consider / progression for next session:           PT Education - 07/25/21 0833     Education Details consolidated and reviewed HEP              PT Short Term Goals - 07/09/21 0811       PT SHORT TERM GOAL #1  Title The patient will be independent in an initial ACLR home exercise program.    Period Weeks    Status Achieved      PT SHORT TERM GOAL #2   Title The patient will present with improvement of left knee flexion active motion to 90 degrees for transfers and ambulation.    Status Achieved      PT SHORT TERM GOAL #3   Title The patient will be able to perform 30 reps of left SLR without an extensor lag for transition to waking without an AD.    Period Weeks    Status Achieved               PT Long Term Goals - 06/07/21 9381       PT LONG TERM GOAL #1   Title The patient will have an improved FOTO score to 69% or better for return to premorbid activities.    Baseline 34%    Time 12    Period Weeks    Status New    Target Date 08/30/21      PT LONG TERM GOAL #2   Title The patient will be able to perform an eight inch step down with no compensation and good left knee  eccentric control for stairs    Baseline step to pattern w/ crutches    Time 12    Period Weeks    Status New    Target Date 08/30/21      PT LONG TERM GOAL #3   Title The patient will be able to perform a single leg stance on foam x 30 second or greater for return to community work with childern.    Baseline unable to test    Time 12    Period Weeks    Status New    Target Date 08/30/21      PT LONG TERM GOAL #4   Title The patient will be able to squat to full depth with equal weight bearing for lifting.    Baseline unable to test    Time 12    Period Weeks    Status New    Target Date 08/30/21      PT LONG TERM GOAL #5   Title The patient will be able to complete a functonal hop test or Y balance testing and score a 90% or better for return to recreational and community activities.    Baseline not tested    Time 18    Period Weeks    Status New    Target Date 10/11/21                   Plan - 07/25/21 0175     Clinical Impression Statement pt  report she feels she is doing better but was dissappointed regarding her knee ROM last session. today following manual techniques she did increase to 118 degrees. Revised HEP and provided new handout including supine wall heel slides. continued working on gross knee/ hip strengthening which she does well with some compensatoin noted but she is able to correct with min cues.    PT Treatment/Interventions ADLs/Self Care Home Management;Aquatic Therapy;Cryotherapy;Electrical Stimulation;Moist Heat;Gait training;Stair training;Functional mobility training;Therapeutic activities;Therapeutic exercise;Balance training;Neuromuscular re-education;Manual techniques;Patient/family education;Passive range of motion;Dry needling;Joint Manipulations;Spinal Manipulations;Taping    PT Next Visit Plan Streamline  HEP with closed chain as appropriate , quadriceps strengthening, 7th week protocol . hip strengthening, stretches, continue gait  training, CKC strengthening, quad set with sLR (update HEP), progress  hip strength    PT Home Exercise Plan Access Code: [email or web address] (updated new code due to old code not working)    Financial planner with Plan of Care Patient             Patient will benefit from skilled therapeutic intervention in order to improve the following deficits and impairments:  Abnormal gait, Decreased range of motion, Difficulty walking, Pain, Decreased balance, Impaired flexibility, Improper body mechanics, Decreased mobility, Decreased strength, Increased edema  Visit Diagnosis: Decreased range of motion of left knee  Acute pain of left knee  Difficulty in walking, not elsewhere classified  Muscle weakness (generalized)     Problem List Patient Active Problem List   Diagnosis Date Noted   BMI 27.0-27.9,adult    Lulu Riding PT, DPT, LAT, ATC  07/25/21  8:57 AM     [facility] Outpatient [facility] [address] [place], [place], [address] Phone: [phone]   Fax:  [phone]  Name: Megan Bryan MRN: [id number] Date of Birth: 1981-05-25

## 2021-07-25 NOTE — Patient Instructions (Signed)
Access Code: [email or web address] URL: [email or web address] Date: 07/25/2021 Prepared by: Lulu Riding  Exercises Prone Hip Extension with Bent Knee - 1 x daily - 7 x weekly - 3 sets - 15 reps Seated Hamstring Stretch - 1 x daily - 7 x weekly - 2 sets - 2 reps - 30 hold Supine Knee Posterior Glide Self-Mobilization - 1 x daily - 7 x weekly - 2 sets - 10 reps - 5 seconds hold Supine Knee Flexion Wall Slide - 1 x daily - 7 x weekly - 2 sets - 10 reps Standard Lunge - 1 x daily - 7 x weekly - 3 sets - 10 reps Lateral Lunge - 1 x daily - 7 x weekly - 2 sets - 10 reps Standing Single Leg Heel Raise - 1 x daily - 7 x weekly - 2 sets - 2 reps Side Stepping with Resistance at Feet - 1 x daily - 7 x weekly - 3 sets - 15 reps Seated Hamstring Curl with Anchored Resistance - 1 x daily - 7 x weekly - 3 sets - 12 reps

## 2021-07-30 ENCOUNTER — Other Ambulatory Visit: Payer: Self-pay

## 2021-07-30 ENCOUNTER — Ambulatory Visit: Payer: 59 | Admitting: Physical Therapy

## 2021-07-30 DIAGNOSIS — R262 Difficulty in walking, not elsewhere classified: Secondary | ICD-10-CM

## 2021-07-30 DIAGNOSIS — M6281 Muscle weakness (generalized): Secondary | ICD-10-CM

## 2021-07-30 DIAGNOSIS — M25562 Pain in left knee: Secondary | ICD-10-CM

## 2021-07-30 DIAGNOSIS — M25662 Stiffness of left knee, not elsewhere classified: Secondary | ICD-10-CM

## 2021-07-30 NOTE — Therapy (Signed)
[facility] [address] [place], [place], [address] Phone: [phone]   Fax:  [phone]  Physical Therapy Treatment  Patient Details  Name: Megan Bryan MRN: [id number] Date of Birth: 1981/08/26 Referring Provider (PT): Bjorn Pippin, MD   Encounter Date: 07/30/2021   PT End of Session - 07/30/21 0948     Visit Number 16    Number of Visits 24    Date for PT Re-Evaluation 08/30/21    Authorization Type [employer]    PT Start Time 0801    PT Stop Time 0855    PT Time Calculation (min) 54 min             Past Medical History:  Diagnosis Date   BMI 27.0-27.9,adult     Past Surgical History:  Procedure Laterality Date   ANTERIOR CRUCIATE LIGAMENT REPAIR Left 06/05/2021   Procedure: ANTERIOR CRUCIATE LIGAMENT (ACL) REPAIR with patellar tendon graft;  Surgeon: Bjorn Pippin, MD;  Location: [facility];  Service: Orthopedics;  Laterality: Left;   KNEE ARTHROSCOPY WITH LATERAL MENISECTOMY Left 06/05/2021   Procedure: KNEE ARTHROSCOPY WITH LATERAL MENISECTOMY;  Surgeon: Bjorn Pippin, MD;  Location: [facility];  Service: Orthopedics;  Laterality: Left;   KNEE ARTHROSCOPY WITH MEDIAL MENISECTOMY Left 06/05/2021   Procedure: KNEE ARTHROSCOPY WITH MEDIAL MENISECTOMY;  Surgeon: Bjorn Pippin, MD;  Location: [facility];  Service: Orthopedics;  Laterality: Left;   TUBAL LIGATION      There were no vitals filed for this visit.   Subjective Assessment - 07/30/21 0947     Subjective I am just sore.    Currently in Pain? No/denies                [facility] PT Assessment - 07/30/21 0001       AROM   Left Knee Flexion 117      PROM   Left Knee Flexion 124             [facility] Adult PT Treatment/Exercise:   Therapeutic Exercise: Recumbent bike L3 x 6 min starting seat at lowest position TRX reverse lunges 10 x 2 each with mod cues for form , TRX lateral lunges x  10 each with mod cues for form [employer] Swings 10#- mod cues for form  OMEGA hamstring curls 25# bilateral, LLE eccentric  Blue oval SLS with hip abduction x 15 Yoga strap hamstring stretch and quad stretch (prone) 60 sec each   Manual Therapy:  Tibiofemoral mobs grade 3 hooklying follow by passive knee flexion     Neuromuscular re-ed: N/A   Therapeutic Activity: N/A   Modalities: Cryotherapy Ice pack left knee x 10 min   Self Care: N/A   Consider / progression for next session:        PT Short Term Goals - 07/09/21 5465       PT SHORT TERM GOAL #1   Title The patient will be independent in an initial ACLR home exercise program.    Period Weeks    Status Achieved      PT SHORT TERM GOAL #2   Title The patient will present with improvement of left knee flexion active motion to 90 degrees for transfers and ambulation.    Status Achieved      PT SHORT TERM GOAL #3   Title The patient will be able to perform 30 reps of left SLR without an extensor lag for transition to  waking without an AD.    Period Weeks    Status Achieved               PT Long Term Goals - 06/07/21 9242       PT LONG TERM GOAL #1   Title The patient will have an improved FOTO score to 69% or better for return to premorbid activities.    Baseline 34%    Time 12    Period Weeks    Status New    Target Date 08/30/21      PT LONG TERM GOAL #2   Title The patient will be able to perform an eight inch step down with no compensation and good left knee eccentric control for stairs    Baseline step to pattern w/ crutches    Time 12    Period Weeks    Status New    Target Date 08/30/21      PT LONG TERM GOAL #3   Title The patient will be able to perform a single leg stance on foam x 30 second or greater for return to community work with childern.    Baseline unable to test    Time 12    Period Weeks    Status New    Target Date 08/30/21      PT LONG TERM GOAL #4   Title The  patient will be able to squat to full depth with equal weight bearing for lifting.    Baseline unable to test    Time 12    Period Weeks    Status New    Target Date 08/30/21      PT LONG TERM GOAL #5   Title The patient will be able to complete a functonal hop test or Y balance testing and score a 90% or better for return to recreational and community activities.    Baseline not tested    Time 18    Period Weeks    Status New    Target Date 10/11/21                   Plan - 07/30/21 0943     Clinical Impression Statement [name] reports soreness on arrival. Used TRX straps to off load lunges and focus on form. Mod cues to perform with correct form. Her knee flexion AROM 117 today with PROM 124. Worked on hamstring strength with gym machines and KB swings. Discussed warm up and stretching prior to HEP strengthening as well as ice post HEP to decrease her soreness. Ice applied at end of session.    PT Next Visit Plan quad/ham strength- KB swings protocol , CKC, continue TRX, SLS    PT Home Exercise Plan Access Code: [email or web address] (updated new code due to old code not working)             Patient will benefit from skilled therapeutic intervention in order to improve the following deficits and impairments:  Abnormal gait, Decreased range of motion, Difficulty walking, Pain, Decreased balance, Impaired flexibility, Improper body mechanics, Decreased mobility, Decreased strength, Increased edema  Visit Diagnosis: Decreased range of motion of left knee  Acute pain of left knee  Difficulty in walking, not elsewhere classified  Muscle weakness (generalized)     Problem List Patient Active Problem List   Diagnosis Date Noted   BMI 27.0-27.9,adult     Sherrie Mustache, PTA 07/30/2021, 9:48 AM  [facility] [address]  [address] [place], [place], [address] Phone: [phone]   Fax:  [phone]  Name: Megan Bryan MRN: [id number] Date of Birth: [birth year]

## 2021-08-01 ENCOUNTER — Other Ambulatory Visit: Payer: Self-pay

## 2021-08-01 ENCOUNTER — Ambulatory Visit: Payer: 59 | Admitting: Physical Therapy

## 2021-08-01 DIAGNOSIS — R262 Difficulty in walking, not elsewhere classified: Secondary | ICD-10-CM

## 2021-08-01 DIAGNOSIS — M25662 Stiffness of left knee, not elsewhere classified: Secondary | ICD-10-CM

## 2021-08-01 DIAGNOSIS — M6281 Muscle weakness (generalized): Secondary | ICD-10-CM

## 2021-08-01 DIAGNOSIS — M25562 Pain in left knee: Secondary | ICD-10-CM

## 2021-08-01 NOTE — Therapy (Signed)
[facility] Outpatient [facility] [address] [place], [place], [address] Phone: [phone]   Fax:  [phone]  Physical Therapy Treatment  Patient Details  Name: Megan Bryan MRN: [id number] Date of Birth: 08-09-1981 Referring Provider (PT): Bjorn Pippin, MD   Encounter Date: 08/01/2021   PT End of Session - 08/01/21 0830     Visit Number 7    Number of Visits 24    Date for PT Re-Evaluation 08/30/21    Authorization Type [employer]    PT Start Time 0803    PT Stop Time 0855    PT Time Calculation (min) 52 min             Past Medical History:  Diagnosis Date   BMI 27.0-27.9,adult     Past Surgical History:  Procedure Laterality Date   ANTERIOR CRUCIATE LIGAMENT REPAIR Left 06/05/2021   Procedure: ANTERIOR CRUCIATE LIGAMENT (ACL) REPAIR with patellar tendon graft;  Surgeon: Bjorn Pippin, MD;  Location: [facility];  Service: Orthopedics;  Laterality: Left;   KNEE ARTHROSCOPY WITH LATERAL MENISECTOMY Left 06/05/2021   Procedure: KNEE ARTHROSCOPY WITH LATERAL MENISECTOMY;  Surgeon: Bjorn Pippin, MD;  Location: [facility];  Service: Orthopedics;  Laterality: Left;   KNEE ARTHROSCOPY WITH MEDIAL MENISECTOMY Left 06/05/2021   Procedure: KNEE ARTHROSCOPY WITH MEDIAL MENISECTOMY;  Surgeon: Bjorn Pippin, MD;  Location: [facility];  Service: Orthopedics;  Laterality: Left;   TUBAL LIGATION      There were no vitals filed for this visit.       [facility] PT Assessment - 08/01/21 0001       AROM   Left Knee Flexion 120             [facility] Adult PT Treatment/Exercise:   Therapeutic Exercise: Recumbent bike L3 x 6 min starting seat at lowest position stationary lunges 10 x 2 each with mod cues for form- using T.M bars ,  lateral lunges x 10 each with mod cues for form Megan Bryan Swings 10#- mod cues for form  OMEGA hamstring curls 25# bilateral, LLE eccentric  Blue oval SLS  with hip abduction x 15, x 15 hip flexion, x 15 hip ext Spilt squat 10 x 2 - using chair for stability Yoga strap hamstring stretch and quad stretch (prone) 60 sec each Supine single leg bridge 10 x2  left     Manual Therapy:       Neuromuscular re-ed: N/A   Therapeutic Activity: N/A   Modalities: Cryotherapy Ice pack left knee x 10 min   Self Care: N/A   Consider / progression for next session:        PT Short Term Goals - 07/09/21 7106       PT SHORT TERM GOAL #1   Title The patient will be independent in an initial ACLR home exercise program.    Period Weeks    Status Achieved      PT SHORT TERM GOAL #2   Title The patient will present with improvement of left knee flexion active motion to 90 degrees for transfers and ambulation.    Status Achieved      PT SHORT TERM GOAL #3   Title The patient will be able to perform 30 reps of left SLR without an extensor lag for transition to waking without an AD.    Period Weeks    Status Achieved  PT Long Term Goals - 06/07/21 8676       PT LONG TERM GOAL #1   Title The patient will have an improved FOTO score to 69% or better for return to premorbid activities.    Baseline 34%    Time 12    Period Weeks    Status New    Target Date 08/30/21      PT LONG TERM GOAL #2   Title The patient will be able to perform an eight inch step down with no compensation and good left knee eccentric control for stairs    Baseline step to pattern w/ crutches    Time 12    Period Weeks    Status New    Target Date 08/30/21      PT LONG TERM GOAL #3   Title The patient will be able to perform a single leg stance on foam x 30 second or greater for return to community work with childern.    Baseline unable to test    Time 12    Period Weeks    Status New    Target Date 08/30/21      PT LONG TERM GOAL #4   Title The patient will be able to squat to full depth with equal weight bearing for lifting.     Baseline unable to test    Time 12    Period Weeks    Status New    Target Date 08/30/21      PT LONG TERM GOAL #5   Title The patient will be able to complete a functonal hop test or Y balance testing and score a 90% or better for return to recreational and community activities.    Baseline not tested    Time 18    Period Weeks    Status New    Target Date 10/11/21                   Plan - 08/01/21 0851     Clinical Impression Statement Megan Bryan reports some lateral soreness of knee which resolved after biking today. Continued with closed chain LE strengthening using UE assist to promote form and repetitions. She toelrated the session well with fatigue, no increased pain.    PT Treatment/Interventions ADLs/Self Care Home Management;Aquatic Therapy;Cryotherapy;Electrical Stimulation;Moist Heat;Gait training;Stair training;Functional mobility training;Therapeutic activities;Therapeutic exercise;Balance training;Neuromuscular re-education;Manual techniques;Patient/family education;Passive range of motion;Dry needling;Joint Manipulations;Spinal Manipulations;Taping    PT Next Visit Plan quad/ham strength- KB swings protocol , CKC, continue TRX, SLS    PT Home Exercise Plan Access Code: [email or web address] (updated new code due to old code not working)             Patient will benefit from skilled therapeutic intervention in order to improve the following deficits and impairments:  Abnormal gait, Decreased range of motion, Difficulty walking, Pain, Decreased balance, Impaired flexibility, Improper body mechanics, Decreased mobility, Decreased strength, Increased edema  Visit Diagnosis: Decreased range of motion of left knee  Acute pain of left knee  Difficulty in walking, not elsewhere classified  Muscle weakness (generalized)     Problem List Patient Active Problem List   Diagnosis Date Noted   BMI 27.0-27.9,adult     Sherrie Mustache, PTA 08/01/2021, 8:55 AM  [facility] [address] [place], [place], [address] Phone: [phone]   Fax:  [phone]  Name: Megan Bryan MRN: [id number] Date of Birth: 01-Aug-1981

## 2021-08-05 ENCOUNTER — Other Ambulatory Visit: Payer: Self-pay

## 2021-08-05 ENCOUNTER — Encounter: Payer: Self-pay | Admitting: Physical Therapy

## 2021-08-05 ENCOUNTER — Ambulatory Visit: Payer: 59 | Admitting: Physical Therapy

## 2021-08-05 DIAGNOSIS — M25662 Stiffness of left knee, not elsewhere classified: Secondary | ICD-10-CM

## 2021-08-05 DIAGNOSIS — M6281 Muscle weakness (generalized): Secondary | ICD-10-CM

## 2021-08-05 DIAGNOSIS — M25562 Pain in left knee: Secondary | ICD-10-CM

## 2021-08-05 DIAGNOSIS — R262 Difficulty in walking, not elsewhere classified: Secondary | ICD-10-CM

## 2021-08-05 NOTE — Therapy (Signed)
[facility] Outpatient Rehabilitation [facility] [address] [place], [place], [address] Phone: [phone]   Fax:  [phone]  Physical Therapy Treatment  Patient Details  Name: Megan Bryan MRN: [id number] Date of Birth: 1981-04-08 Referring Provider (PT): Bjorn Pippin, MD   Encounter Date: 08/05/2021   PT End of Session - 08/05/21 0718     Visit Number 8    Number of Visits 24    Date for PT Re-Evaluation 08/30/21    Authorization Type [employer]    PT Start Time 0715    PT Stop Time 0810    PT Time Calculation (min) 55 min             Past Medical History:  Diagnosis Date   BMI 27.0-27.9,adult     Past Surgical History:  Procedure Laterality Date   ANTERIOR CRUCIATE LIGAMENT REPAIR Left 06/05/2021   Procedure: ANTERIOR CRUCIATE LIGAMENT (ACL) REPAIR with patellar tendon graft;  Surgeon: Bjorn Pippin, MD;  Location: [facility];  Service: Orthopedics;  Laterality: Left;   KNEE ARTHROSCOPY WITH LATERAL MENISECTOMY Left 06/05/2021   Procedure: KNEE ARTHROSCOPY WITH LATERAL MENISECTOMY;  Surgeon: Bjorn Pippin, MD;  Location: [facility];  Service: Orthopedics;  Laterality: Left;   KNEE ARTHROSCOPY WITH MEDIAL MENISECTOMY Left 06/05/2021   Procedure: KNEE ARTHROSCOPY WITH MEDIAL MENISECTOMY;  Surgeon: Bjorn Pippin, MD;  Location: [facility];  Service: Orthopedics;  Laterality: Left;   TUBAL LIGATION      There were no vitals filed for this visit.   Subjective Assessment - 08/05/21 0717     Subjective It was a little stiff but the stiffness is working out.    Currently in Pain? No/denies             Therapeutic Exercise: Recumbent bike L3 x 6 min starting seat at lowest position stationary lunges 10 x 2 each with mod cues for form- using TRX straps,  curtsey lunges x 5 each with mod cues for form Thereasa Parkin Squats 15#- min cues for form  Single leg DL with 5#- runners stance x 10 x  2- focus on maintaining SLS  8 inch step down from cybex hip machine x 10- bilat UE- mod cues , 4inch step downs ( heel strike with 1 UE support x 10 ) Blue oval SLS with rebounder toss Spilt squat 10 x 2 - using chair for stability- left foot forward Yoga strap hamstring stretch and quad stretch (prone) 60 sec each, hip flexor stretch with strap x 30 sec  Supine single leg bridge 10 x2  left     Manual Therapy:       Neuromuscular re-ed: N/A   Therapeutic Activity: N/A   Modalities: Cryotherapy Ice pack left knee x 10 min   Self Care: N/A   Consider / progression for next session:       PT Short Term Goals - 07/09/21 8264       PT SHORT TERM GOAL #1   Title The patient will be independent in an initial ACLR home exercise program.    Period Weeks    Status Achieved      PT SHORT TERM GOAL #2   Title The patient will present with improvement of left knee flexion active motion to 90 degrees for transfers and ambulation.    Status Achieved      PT SHORT TERM GOAL #3   Title The patient will be able to perform  30 reps of left SLR without an extensor lag for transition to waking without an AD.    Period Weeks    Status Achieved               PT Long Term Goals - 06/07/21 0175       PT LONG TERM GOAL #1   Title The patient will have an improved FOTO score to 69% or better for return to premorbid activities.    Baseline 34%    Time 12    Period Weeks    Status New    Target Date 08/30/21      PT LONG TERM GOAL #2   Title The patient will be able to perform an eight inch step down with no compensation and good left knee eccentric control for stairs    Baseline step to pattern w/ crutches    Time 12    Period Weeks    Status New    Target Date 08/30/21      PT LONG TERM GOAL #3   Title The patient will be able to perform a single leg stance on foam x 30 second or greater for return to community work with childern.    Baseline unable to test    Time  12    Period Weeks    Status New    Target Date 08/30/21      PT LONG TERM GOAL #4   Title The patient will be able to squat to full depth with equal weight bearing for lifting.    Baseline unable to test    Time 12    Period Weeks    Status New    Target Date 08/30/21      PT LONG TERM GOAL #5   Title The patient will be able to complete a functonal hop test or Y balance testing and score a 90% or better for return to recreational and community activities.    Baseline not tested    Time 18    Period Weeks    Status New    Target Date 10/11/21                   Plan - 08/05/21 1013     Clinical Impression Statement Megan Bryan reports her usual soreness and stiffness in left knee. Continued with closed chain strengthening and eccentric quad strength. After stretching her knee AROM measured 122 degrees. No increased pain during session.    PT Treatment/Interventions ADLs/Self Care Home Management;Aquatic Therapy;Cryotherapy;Electrical Stimulation;Moist Heat;Gait training;Stair training;Functional mobility training;Therapeutic activities;Therapeutic exercise;Balance training;Neuromuscular re-education;Manual techniques;Patient/family education;Passive range of motion;Dry needling;Joint Manipulations;Spinal Manipulations;Taping    PT Next Visit Plan quad/ham strength- KB swings protocol , CKC, continue TRX, SLS    PT Home Exercise Plan Access Code: [email or web address] (updated new code due to old code not working)             Patient will benefit from skilled therapeutic intervention in order to improve the following deficits and impairments:  Abnormal gait, Decreased range of motion, Difficulty walking, Pain, Decreased balance, Impaired flexibility, Improper body mechanics, Decreased mobility, Decreased strength, Increased edema  Visit Diagnosis: Decreased range of motion of left knee  Acute pain of left knee  Difficulty in walking, not elsewhere classified  Muscle weakness  (generalized)     Problem List Patient Active Problem List   Diagnosis Date Noted   BMI 27.0-27.9,adult     Sherrie Mustache, PTA 08/05/2021, 10:31 AM  [facility] Outpatient  Rehabilitation [facility] [address] [place], [place], [address] Phone: [phone]   Fax:  [phone]  Name: Megan Bryan MRN: [id number] Date of Birth: 03-Apr-1981

## 2021-08-06 ENCOUNTER — Encounter: Payer: 59 | Admitting: Physical Therapy

## 2021-08-07 ENCOUNTER — Encounter: Payer: Self-pay | Admitting: Physical Therapy

## 2021-08-07 ENCOUNTER — Other Ambulatory Visit: Payer: Self-pay

## 2021-08-07 ENCOUNTER — Ambulatory Visit: Payer: 59 | Admitting: Physical Therapy

## 2021-08-07 DIAGNOSIS — M6281 Muscle weakness (generalized): Secondary | ICD-10-CM

## 2021-08-07 DIAGNOSIS — M25662 Stiffness of left knee, not elsewhere classified: Secondary | ICD-10-CM

## 2021-08-07 DIAGNOSIS — M25562 Pain in left knee: Secondary | ICD-10-CM

## 2021-08-07 DIAGNOSIS — R262 Difficulty in walking, not elsewhere classified: Secondary | ICD-10-CM

## 2021-08-07 NOTE — Therapy (Signed)
[facility] Outpatient Rehabilitation [facility] [address] [place], [place], [address] Phone: [phone]   Fax:  [phone]  Physical Therapy Treatment  Patient Details  Name: Megan Bryan MRN: [id number] Date of Birth: 1981-05-18 Referring Provider (PT): Bjorn Pippin, MD   Encounter Date: 08/07/2021   PT End of Session - 08/07/21 0750     Visit Number 9    Number of Visits 24    Date for PT Re-Evaluation 08/30/21    Authorization Type [employer]    PT Start Time 0715    PT Stop Time 0810    PT Time Calculation (min) 55 min             Past Medical History:  Diagnosis Date   BMI 27.0-27.9,adult     Past Surgical History:  Procedure Laterality Date   ANTERIOR CRUCIATE LIGAMENT REPAIR Left 06/05/2021   Procedure: ANTERIOR CRUCIATE LIGAMENT (ACL) REPAIR with patellar tendon graft;  Surgeon: Bjorn Pippin, MD;  Location: [facility];  Service: Orthopedics;  Laterality: Left;   KNEE ARTHROSCOPY WITH LATERAL MENISECTOMY Left 06/05/2021   Procedure: KNEE ARTHROSCOPY WITH LATERAL MENISECTOMY;  Surgeon: Bjorn Pippin, MD;  Location: [facility];  Service: Orthopedics;  Laterality: Left;   KNEE ARTHROSCOPY WITH MEDIAL MENISECTOMY Left 06/05/2021   Procedure: KNEE ARTHROSCOPY WITH MEDIAL MENISECTOMY;  Surgeon: Bjorn Pippin, MD;  Location: [facility];  Service: Orthopedics;  Laterality: Left;   TUBAL LIGATION      There were no vitals filed for this visit.   Subjective Assessment - 08/07/21 0754     Subjective No pain this morning.             Therapeutic Exercise: Recumbent bike L3 x 6 min starting seat at lowest position Dynamic lunges x 8, stationary lunges in door way x 10 each  Deadlift 25# KB 10 x 4 sets TRX deep squats 10 x 2 sets Bench bridge 10 x 3 sets Spilt squat 10 x 2 - min use of chair for stability- left foot forward Yoga strap hamstring stretch and quad stretch (prone) 60 sec  each, hip flexor stretch   with strap x 30 sec      Manual Therapy:       Neuromuscular re-ed: N/A   Therapeutic Activity: N/A   Modalities: Cryotherapy Ice pack left knee x 10 min          PT Short Term Goals - 07/09/21 5465       PT SHORT TERM GOAL #1   Title The patient will be independent in an initial ACLR home exercise program.    Period Weeks    Status Achieved      PT SHORT TERM GOAL #2   Title The patient will present with improvement of left knee flexion active motion to 90 degrees for transfers and ambulation.    Status Achieved      PT SHORT TERM GOAL #3   Title The patient will be able to perform 30 reps of left SLR without an extensor lag for transition to waking without an AD.    Period Weeks    Status Achieved               PT Long Term Goals - 06/07/21 6812       PT LONG TERM GOAL #1   Title The patient will have an improved FOTO score to 69% or better for return to premorbid activities.  Baseline 34%    Time 12    Period Weeks    Status New    Target Date 08/30/21      PT LONG TERM GOAL #2   Title The patient will be able to perform an eight inch step down with no compensation and good left knee eccentric control for stairs    Baseline step to pattern w/ crutches    Time 12    Period Weeks    Status New    Target Date 08/30/21      PT LONG TERM GOAL #3   Title The patient will be able to perform a single leg stance on foam x 30 second or greater for return to community work with childern.    Baseline unable to test    Time 12    Period Weeks    Status New    Target Date 08/30/21      PT LONG TERM GOAL #4   Title The patient will be able to squat to full depth with equal weight bearing for lifting.    Baseline unable to test    Time 12    Period Weeks    Status New    Target Date 08/30/21      PT LONG TERM GOAL #5   Title The patient will be able to complete a functonal hop test or Y balance testing and score a 90%  or better for return to recreational and community activities.    Baseline not tested    Time 18    Period Weeks    Status New    Target Date 10/11/21                   Plan - 08/07/21 0750     Clinical Impression Statement Lannah arrives without pain or soreness. Continued with LE strengthening without increased pain, only fatigue.    PT Treatment/Interventions ADLs/Self Care Home Management;Aquatic Therapy;Cryotherapy;Electrical Stimulation;Moist Heat;Gait training;Stair training;Functional mobility training;Therapeutic activities;Therapeutic exercise;Balance training;Neuromuscular re-education;Manual techniques;Patient/family education;Passive range of motion;Dry needling;Joint Manipulations;Spinal Manipulations;Taping    PT Next Visit Plan quad/ham strength- KB swings protocol , CKC, continue TRX, SLS    PT Home Exercise Plan Access Code: [email or web address] (updated new code due to old code not working)             Patient will benefit from skilled therapeutic intervention in order to improve the following deficits and impairments:  Abnormal gait, Decreased range of motion, Difficulty walking, Pain, Decreased balance, Impaired flexibility, Improper body mechanics, Decreased mobility, Decreased strength, Increased edema  Visit Diagnosis: Decreased range of motion of left knee  Acute pain of left knee  Difficulty in walking, not elsewhere classified  Muscle weakness (generalized)     Problem List Patient Active Problem List   Diagnosis Date Noted   BMI 27.0-27.9,adult     Sherrie Mustache, PTA 08/07/2021, 8:14 AM  [facility] [address] [place], [place], [address] Phone: [phone]   Fax:  [phone]  Name: Megan Bryan MRN: [id number] Date of Birth: 08/16/1981

## 2021-08-08 ENCOUNTER — Encounter: Payer: 59 | Admitting: Physical Therapy

## 2021-08-12 ENCOUNTER — Ambulatory Visit: Payer: 59 | Admitting: Physical Therapy

## 2021-08-12 ENCOUNTER — Other Ambulatory Visit: Payer: Self-pay

## 2021-08-12 ENCOUNTER — Encounter: Payer: Self-pay | Admitting: Physical Therapy

## 2021-08-12 DIAGNOSIS — R262 Difficulty in walking, not elsewhere classified: Secondary | ICD-10-CM

## 2021-08-12 DIAGNOSIS — M25562 Pain in left knee: Secondary | ICD-10-CM

## 2021-08-12 DIAGNOSIS — M6281 Muscle weakness (generalized): Secondary | ICD-10-CM

## 2021-08-12 DIAGNOSIS — M25662 Stiffness of left knee, not elsewhere classified: Secondary | ICD-10-CM

## 2021-08-12 NOTE — Therapy (Signed)
[facility] Outpatient Rehabilitation [facility] [address] [place], [place], [address] Phone: [phone]   Fax:  [phone]  Physical Therapy Treatment  Patient Details  Name: Megan Bryan MRN: [id number] Date of Birth: 22-May-1981 Referring Provider (PT): Bjorn Pippin, MD   Encounter Date: 08/12/2021   PT End of Session - 08/12/21 0809     Visit Number 10    Number of Visits 24    Date for PT Re-Evaluation 08/30/21    Authorization Type [employer]    PT Start Time 0715    PT Stop Time 0815    PT Time Calculation (min) 60 min             Past Medical History:  Diagnosis Date   BMI 27.0-27.9,adult     Past Surgical History:  Procedure Laterality Date   ANTERIOR CRUCIATE LIGAMENT REPAIR Left 06/05/2021   Procedure: ANTERIOR CRUCIATE LIGAMENT (ACL) REPAIR with patellar tendon graft;  Surgeon: Bjorn Pippin, MD;  Location: [facility];  Service: Orthopedics;  Laterality: Left;   KNEE ARTHROSCOPY WITH LATERAL MENISECTOMY Left 06/05/2021   Procedure: KNEE ARTHROSCOPY WITH LATERAL MENISECTOMY;  Surgeon: Bjorn Pippin, MD;  Location: [facility];  Service: Orthopedics;  Laterality: Left;   KNEE ARTHROSCOPY WITH MEDIAL MENISECTOMY Left 06/05/2021   Procedure: KNEE ARTHROSCOPY WITH MEDIAL MENISECTOMY;  Surgeon: Bjorn Pippin, MD;  [facility];  Service: Orthopedics;  Laterality: Left;   TUBAL LIGATION      There were no vitals filed for this visit.   Subjective Assessment - 08/12/21 0722     Subjective No pain.    Currently in Pain? No/denies                [facility] PT Assessment - 08/12/21 0001       AROM   Left Knee Flexion 123   127 at end of session ( also stretched prior to initial measure)            Therapeutic Exercise: Recumbent bike L3 x 6 min starting seat at lowest position Dynamic lunges x 12 along hallway Deadlift 25# KB 10 x 4 sets TRX deep squats 10 x 2  sets Foam oval SLS with squat to cone touch - runners arm 10 x 2 Bench bridge 10 x 3 sets Spilt squat 10 x 4 - min use of chair for stability- left foot forward 8 inch step down x 10 -1 UE  Yoga strap hamstring stretch and quad stretch (prone) 60 sec each, hip flexor stretch   with strap x 30 sec (before and after therex)     Manual Therapy:       Neuromuscular re-ed: N/A   Therapeutic Activity: N/A   Modalities: Cryotherapy Ice pack left knee x 10 min                             PT Short Term Goals - 07/09/21 6237       PT SHORT TERM GOAL #1   Title The patient will be independent in an initial ACLR home exercise program.    Period Weeks    Status Achieved      PT SHORT TERM GOAL #2   Title The patient will present with improvement of left knee flexion active motion to 90 degrees for transfers and ambulation.    Status Achieved      PT SHORT TERM GOAL #  3   Title The patient will be able to perform 30 reps of left SLR without an extensor lag for transition to waking without an AD.    Period Weeks    Status Achieved               PT Long Term Goals - 06/07/21 3300       PT LONG TERM GOAL #1   Title The patient will have an improved FOTO score to 69% or better for return to premorbid activities.    Baseline 34%    Time 12    Period Weeks    Status New    Target Date 08/30/21      PT LONG TERM GOAL #2   Title The patient will be able to perform an eight inch step down with no compensation and good left knee eccentric control for stairs    Baseline step to pattern w/ crutches    Time 12    Period Weeks    Status New    Target Date 08/30/21      PT LONG TERM GOAL #3   Title The patient will be able to perform a single leg stance on foam x 30 second or greater for return to community work with childern.    Baseline unable to test    Time 12    Period Weeks    Status New    Target Date 08/30/21      PT LONG TERM GOAL #4    Title The patient will be able to squat to full depth with equal weight bearing for lifting.    Baseline unable to test    Time 12    Period Weeks    Status New    Target Date 08/30/21      PT LONG TERM GOAL #5   Title The patient will be able to complete a functonal hop test or Y balance testing and score a 90% or better for return to recreational and community activities.    Baseline not tested    Time 18    Period Weeks    Status New    Target Date 10/11/21                   Plan - 08/12/21 0828     Clinical Impression Statement Megan Bryan arrives with no pain. Began with stretching to increase available ROM during therex. She is improving her dynamic lunge, SL squat, and split squat with less need for UE assist. Stretched again post sessiion with achievement of 127 degrees.    PT Next Visit Plan quad/ham strength- KB swings protocol , CKC, continue TRX, SLS    PT Home Exercise Plan Access Code: [email or web address] (updated new code due to old code not working)             Patient will benefit from skilled therapeutic intervention in order to improve the following deficits and impairments:  Abnormal gait, Decreased range of motion, Difficulty walking, Pain, Decreased balance, Impaired flexibility, Improper body mechanics, Decreased mobility, Decreased strength, Increased edema  Visit Diagnosis: Decreased range of motion of left knee  Difficulty in walking, not elsewhere classified  Acute pain of left knee  Muscle weakness (generalized)     Problem List Patient Active Problem List   Diagnosis Date Noted   BMI 27.0-27.9,adult     Sherrie Mustache, PTA 08/12/2021, 8:30 AM  [facility] [address] [place], [place], [address]  Phone: [phone]   Fax:  [phone]  Name: Megan Bryan MRN: [id number] Date of Birth: Aug 09, 1981

## 2021-08-13 ENCOUNTER — Encounter: Payer: 59 | Admitting: Physical Therapy

## 2021-08-14 ENCOUNTER — Ambulatory Visit: Payer: 59 | Admitting: Physical Therapy

## 2021-08-14 ENCOUNTER — Other Ambulatory Visit: Payer: Self-pay

## 2021-08-14 ENCOUNTER — Encounter: Payer: Self-pay | Admitting: Physical Therapy

## 2021-08-14 DIAGNOSIS — R262 Difficulty in walking, not elsewhere classified: Secondary | ICD-10-CM

## 2021-08-14 DIAGNOSIS — M25662 Stiffness of left knee, not elsewhere classified: Secondary | ICD-10-CM | POA: Diagnosis not present

## 2021-08-14 DIAGNOSIS — M6281 Muscle weakness (generalized): Secondary | ICD-10-CM

## 2021-08-14 DIAGNOSIS — M25562 Pain in left knee: Secondary | ICD-10-CM

## 2021-08-14 NOTE — Therapy (Signed)
[place], [place], [address] Phone: [phone]   Fax:  [phone]  Physical Therapy Treatment  Patient Details  Name: Megan Bryan MRN: [id number] Date of Birth: November 26, 1980 Referring Provider (PT): Hiram Gash, MD   Encounter Date: 08/14/2021   PT End of Session - 08/14/21 0812     Visit Number 11    Number of Visits 24    Date for PT Re-Evaluation 08/30/21    Authorization Type [employer]    PT Start Time 0805    PT Stop Time 0902    PT Time Calculation (min) 57 min             Past Medical History:  Diagnosis Date   BMI 27.0-27.9,adult     Past Surgical History:  Procedure Laterality Date   ANTERIOR CRUCIATE LIGAMENT REPAIR Left 06/05/2021   Procedure: ANTERIOR CRUCIATE LIGAMENT (ACL) REPAIR with patellar tendon graft;  Surgeon: Hiram Gash, MD;  Location: [place];  Service: Orthopedics;  Laterality: Left;   KNEE ARTHROSCOPY WITH LATERAL MENISECTOMY Left 06/05/2021   Procedure: KNEE ARTHROSCOPY WITH LATERAL MENISECTOMY;  Surgeon: Hiram Gash, MD;  Location: [place];  Service: Orthopedics;  Laterality: Left;   KNEE ARTHROSCOPY WITH MEDIAL MENISECTOMY Left 06/05/2021   Procedure: KNEE ARTHROSCOPY WITH MEDIAL MENISECTOMY;  Surgeon: Hiram Gash, MD;  Location: [place];  Service: Orthopedics;  Laterality: Left;   TUBAL LIGATION      There were no vitals filed for this visit.   Subjective Assessment - 08/14/21 0810     Subjective No pain.             Therapeutic Exercise: Recumbent bike L3 x 6 min starting seat at lowest position Dynamic lunges x 12 along hallway Deadlift 30# KB 10 x 3 sets TRX deep squats 10 x 4 sets SLS on foam 45 sec  Foam oval SLS with squat to cone touch - runners arm 10 x 2 (not today) Bench bridge 10 x 3 sets- (not today)  Spilt squat 10 x 4 - min use of chair for stability- left foot  forward 8 inch step down x 10 -1 UE (not today)  Yoga strap hamstring stretch and quad stretch (prone) 60 sec each, hip flexor stretch   with strap x 30 sec (before and after therex)       Manual Therapy:  Passive left knee flexion 3 x 30 sex, hooklying tibia mobs      Neuromuscular re-ed: N/A   Therapeutic Activity: N/A   Modalities: Cryotherapy Ice pack left knee x 10 min       [facility] PT Assessment - 08/14/21 0001       AROM   Left Knee Flexion 125   at start of session after initial stretching                PT Short Term Goals - 07/09/21 3845       PT SHORT TERM GOAL #1   Title The patient will be independent in an initial ACLR home exercise program.    Period Weeks    Status Achieved      PT SHORT TERM GOAL #2   Title The patient will present with improvement of left knee flexion active motion to 90 degrees for transfers and ambulation.    Status Achieved      PT SHORT TERM GOAL #3   Title The patient will  be able to perform 30 reps of left SLR without an extensor lag for transition to waking without an AD.    Period Weeks    Status Achieved               PT Long Term Goals - 08/14/21 0840       PT LONG TERM GOAL #1   Title The patient will have an improved FOTO score to 69% or better for return to premorbid activities.    Baseline 34%    Time 12    Period Weeks    Status On-going      PT LONG TERM GOAL #2   Title The patient will be able to perform an eight inch step down with no compensation and good left knee eccentric control for stairs    Baseline step to pattern w/ crutches; 08/14/21- improving control with step down    Time 12    Period Weeks    Status On-going      PT LONG TERM GOAL #3   Title The patient will be able to perform a single leg stance on foam x 30 second or greater for return to community work with childern.    Baseline >30 sec on foam    Time 12    Period Weeks    Status Achieved      PT LONG TERM GOAL #4    Title The patient will be able to squat to full depth with equal weight bearing for lifting.    Baseline improving ROM left knee    Time 12    Period Weeks    Status On-going      PT LONG TERM GOAL #5   Title The patient will be able to complete a functonal hop test or Y balance testing and score a 90% or better for return to recreational and community activities.    Baseline not tested    Time 18    Period Weeks    Status Unable to assess                   Plan - 08/14/21 3382     Clinical Impression Statement Pt saw MD for f/u and was told everything looked good and to continnue PT per protocol. Contiued with closed chain strength and endurance. She is demonstrating improved stability with lunges and is able to perform SLS on foam >30 sec. LTG# 3 met.    PT Treatment/Interventions ADLs/Self Care Home Management;Aquatic Therapy;Cryotherapy;Electrical Stimulation;Moist Heat;Gait training;Stair training;Functional mobility training;Therapeutic activities;Therapeutic exercise;Balance training;Neuromuscular re-education;Manual techniques;Patient/family education;Passive range of motion;Dry needling;Joint Manipulations;Spinal Manipulations;Taping    PT Next Visit Plan quad/ham strength- KB swings protocol , CKC, continue TRX, SLS    PT Home Exercise Plan Access Code: [email or web address] (updated new code due to old code not working)    Oncologist with Plan of Care Patient             Patient will benefit from skilled therapeutic intervention in order to improve the following deficits and impairments:  Abnormal gait, Decreased range of motion, Difficulty walking, Pain, Decreased balance, Impaired flexibility, Improper body mechanics, Decreased mobility, Decreased strength, Increased edema  Visit Diagnosis: Decreased range of motion of left knee  Acute pain of left knee  Difficulty in walking, not elsewhere classified  Muscle weakness (generalized)     Problem  List Patient Active Problem List   Diagnosis Date Noted   BMI 27.0-27.9,adult     Dorene Ar, PTA  08/14/2021, 9:09 AM  [place] [address], [place], [address] Phone: [phone]   Fax:  [phone]  Name: Megan Bryan MRN: [id number] Date of Birth: 04-Jan-1981

## 2021-08-15 ENCOUNTER — Encounter: Payer: 59 | Admitting: Physical Therapy

## 2021-08-19 ENCOUNTER — Other Ambulatory Visit: Payer: Self-pay

## 2021-08-19 ENCOUNTER — Ambulatory Visit: Payer: 59 | Admitting: Physical Therapy

## 2021-08-19 ENCOUNTER — Encounter: Payer: Self-pay | Admitting: Physical Therapy

## 2021-08-19 DIAGNOSIS — M25662 Stiffness of left knee, not elsewhere classified: Secondary | ICD-10-CM | POA: Diagnosis not present

## 2021-08-19 DIAGNOSIS — R262 Difficulty in walking, not elsewhere classified: Secondary | ICD-10-CM

## 2021-08-19 DIAGNOSIS — M6281 Muscle weakness (generalized): Secondary | ICD-10-CM

## 2021-08-19 DIAGNOSIS — M25562 Pain in left knee: Secondary | ICD-10-CM

## 2021-08-19 NOTE — Therapy (Signed)
[facility] Rehabilitation [facility] [address] [place], [place], [address] Phone: [phone]   Fax:  [phone]  Physical Therapy Treatment  Patient Details  Name: Megan Bryan MRN: [id number] Date of Birth: [birth year] Referring Provider (PT): Bjorn Pippin, MD   Encounter Date: 08/19/2021   PT End of Session - 08/19/21 0725     Visit Number 12    Number of Visits 24    Date for PT Re-Evaluation 08/30/21    Authorization Type [employer]    PT Start Time 0721    PT Stop Time 0810    PT Time Calculation (min) 49 min             Past Medical History:  Diagnosis Date   BMI 27.0-27.9,adult     Past Surgical History:  Procedure Laterality Date   ANTERIOR CRUCIATE LIGAMENT REPAIR Left 06/05/2021   Procedure: ANTERIOR CRUCIATE LIGAMENT (ACL) REPAIR with patellar tendon graft;  Surgeon: Bjorn Pippin, MD;  Location: [facility];  Service: Orthopedics;  Laterality: Left;   KNEE ARTHROSCOPY WITH LATERAL MENISECTOMY Left 06/05/2021   Procedure: KNEE ARTHROSCOPY WITH LATERAL MENISECTOMY;  Surgeon: Bjorn Pippin, MD;  Location: [facility];  Service: Orthopedics;  Laterality: Left;   KNEE ARTHROSCOPY WITH MEDIAL MENISECTOMY Left 06/05/2021   Procedure: KNEE ARTHROSCOPY WITH MEDIAL MENISECTOMY;  Surgeon: Bjorn Pippin, MD;  Location: [facility];  Service: Orthopedics;  Laterality: Left;   TUBAL LIGATION      There were no vitals filed for this visit.   Subjective Assessment - 08/19/21 0723     Subjective No pain. I've been practicing going down the stairs. It is still not easy, I am walking 15 minutes on the treadmill.    Currently in Pain? No/denies              Therapeutic Exercise: Recumbent bike L3 x 6 min starting seat at lowest position Dynamic lunges x 12 along hallway (not today)  Squat 15# 10 x 4  Deadlift 30# KB 10 x 4 sets TRX deep squats 10 x 4 sets (not today)  SLS on  foam 45 sec ( not today) Foam oval SLS with squat to cone touch - runners arm 10 x 2 4 inch step downs-with retro step up 10x2 Wall sits 10 sec x 10 x 2  Bench bridge 10 x 3 sets- (not today)  Spilt squat 10 x 3 - min use of chair for stability- left foot forward 8 inch step down x 10 -1 UE (not today)  Yoga strap hamstring stretch and quad stretch (prone) 60 sec each, hip flexor stretch   with strap x 30 sec (before and after therex)       Manual Therapy:  Passive left knee flexion 3 x 30 sex, hooklying tibia mobs      Neuromuscular re-ed: N/A   Therapeutic Activity: N/A   Modalities: Cryotherapy Ice pack left knee x 10 min      [facility] PT Assessment - 08/19/21 0001       AROM   Left Knee Flexion 128   end of session                 PT Short Term Goals - 07/09/21 6222       PT SHORT TERM GOAL #1   Title The patient will be independent in an initial ACLR home exercise program.    Period Weeks    Status Achieved  PT SHORT TERM GOAL #2   Title The patient will present with improvement of left knee flexion active motion to 90 degrees for transfers and ambulation.    Status Achieved      PT SHORT TERM GOAL #3   Title The patient will be able to perform 30 reps of left SLR without an extensor lag for transition to waking without an AD.    Period Weeks    Status Achieved               PT Long Term Goals - 08/14/21 0840       PT LONG TERM GOAL #1   Title The patient will have an improved FOTO score to 69% or better for return to premorbid activities.    Baseline 34%    Time 12    Period Weeks    Status On-going      PT LONG TERM GOAL #2   Title The patient will be able to perform an eight inch step down with no compensation and good left knee eccentric control for stairs    Baseline step to pattern w/ crutches; 08/14/21- improving control with step down    Time 12    Period Weeks    Status On-going      PT LONG TERM GOAL #3   Title The  patient will be able to perform a single leg stance on foam x 30 second or greater for return to community work with childern.    Baseline >30 sec on foam    Time 12    Period Weeks    Status Achieved      PT LONG TERM GOAL #4   Title The patient will be able to squat to full depth with equal weight bearing for lifting.    Baseline improving ROM left knee    Time 12    Period Weeks    Status On-going      PT LONG TERM GOAL #5   Title The patient will be able to complete a functonal hop test or Y balance testing and score a 90% or better for return to recreational and community activities.    Baseline not tested    Time 18    Period Weeks    Status Unable to assess                   Plan - 08/19/21 0750     Clinical Impression Statement Megan Bryan reports continued compliance with T.M walking and HEP. She notes some difficulty with descending stairs. Continued working on quad strength and eccentric control in closed chain. She tolerated session well with no increased pain, only muscle burning. Knee ROM at 128 post session.    PT Treatment/Interventions ADLs/Self Care Home Management;Aquatic Therapy;Cryotherapy;Electrical Stimulation;Moist Heat;Gait training;Stair training;Functional mobility training;Therapeutic activities;Therapeutic exercise;Balance training;Neuromuscular re-education;Manual techniques;Patient/family education;Passive range of motion;Dry needling;Joint Manipulations;Spinal Manipulations;Taping    PT Next Visit Plan quad/ham strength- KB swings protocol , CKC, continue TRX, SLS    PT Home Exercise Plan Access Code: [email or web address] (updated new code due to old code not working)             Patient will benefit from skilled therapeutic intervention in order to improve the following deficits and impairments:  Abnormal gait, Decreased range of motion, Difficulty walking, Pain, Decreased balance, Impaired flexibility, Improper body mechanics, Decreased mobility,  Decreased strength, Increased edema  Visit Diagnosis: Decreased range of motion of left knee  Acute pain of left knee  Difficulty in walking, not elsewhere classified  Muscle weakness (generalized)     Problem List Patient Active Problem List   Diagnosis Date Noted   BMI 27.0-27.9,adult     Megan Bryan, [place] 08/19/2021, 8:24 AM  [facility] [address] [place], [place], [address] Phone: [phone]   Fax:  [phone]  Name: Megan Bryan MRN: [id number] Date of Birth: 1981-09-21

## 2021-08-20 ENCOUNTER — Encounter: Payer: 59 | Admitting: Physical Therapy

## 2021-08-21 ENCOUNTER — Encounter: Payer: Self-pay | Admitting: Physical Therapy

## 2021-08-21 ENCOUNTER — Ambulatory Visit: Payer: 59 | Attending: Orthopaedic Surgery | Admitting: Physical Therapy

## 2021-08-21 ENCOUNTER — Other Ambulatory Visit: Payer: Self-pay

## 2021-08-21 DIAGNOSIS — M25562 Pain in left knee: Secondary | ICD-10-CM | POA: Insufficient documentation

## 2021-08-21 DIAGNOSIS — M25662 Stiffness of left knee, not elsewhere classified: Secondary | ICD-10-CM | POA: Insufficient documentation

## 2021-08-21 DIAGNOSIS — R262 Difficulty in walking, not elsewhere classified: Secondary | ICD-10-CM | POA: Insufficient documentation

## 2021-08-21 DIAGNOSIS — M6281 Muscle weakness (generalized): Secondary | ICD-10-CM | POA: Insufficient documentation

## 2021-08-21 NOTE — Therapy (Signed)
[facility] Outpatient Rehabilitation [facility] [address] [place], [place], [address] Phone: [phone]   Fax:  [phone]  Physical Therapy Treatment  Patient Details  Name: Megan Bryan MRN: [id number] Date of Birth: 03/17/1981 Referring Provider (PT): Bjorn Pippin, MD   Encounter Date: 08/21/2021   PT End of Session - 08/21/21 0805     Visit Number 13    Number of Visits 24    Date for PT Re-Evaluation 08/30/21    Authorization Type [employer]    PT Start Time 0803    Activity Tolerance Patient tolerated treatment well    Behavior During Therapy [facility] for tasks assessed/performed             Past Medical History:  Diagnosis Date   BMI 27.0-27.9,adult     Past Surgical History:  Procedure Laterality Date   ANTERIOR CRUCIATE LIGAMENT REPAIR Left 06/05/2021   Procedure: ANTERIOR CRUCIATE LIGAMENT (ACL) REPAIR with patellar tendon graft;  Surgeon: Bjorn Pippin, MD;  Location: [facility];  Service: Orthopedics;  Laterality: Left;   KNEE ARTHROSCOPY WITH LATERAL MENISECTOMY Left 06/05/2021   Procedure: KNEE ARTHROSCOPY WITH LATERAL MENISECTOMY;  Surgeon: Bjorn Pippin, MD;  Location: [facility];  Service: Orthopedics;  Laterality: Left;   KNEE ARTHROSCOPY WITH MEDIAL MENISECTOMY Left 06/05/2021   Procedure: KNEE ARTHROSCOPY WITH MEDIAL MENISECTOMY;  Surgeon: Bjorn Pippin, MD;  Location: [facility];  Service: Orthopedics;  Laterality: Left;   TUBAL LIGATION      There were no vitals filed for this visit.   Subjective Assessment - 08/21/21 0806     Subjective " no pain or issues. sometimes my left knee feels alittle tired mostly with going down steps, but isn't constant."    Patient Stated Goals To get back to being active, running, and playing basketball.                    [facility] Adult PT Treatment/Exercise:  Therapeutic Exercise: Elliptical L1 x 5 min ramp L1 Slant board calf  stretch 2 x 30  Standing hamstring stretch 2 x 30 sec (foot propped on treadmill) Standing quad stretch 2 x 30 sec (added glute set on second set), fingers in the shoe [place] eccentric hamstring 2 x 5 with bolster in front to prevent going to the table Walking lung 2 x 10 with 2 -5# KB TRX bridge with knee extended 1 x 12, with knees bent 1 x 12, alternating knee ext 1 x 12 with maintained bridge Pistol squat L SLS, 1 x 5 eccentric lowering only, 1 x 10 touching down onto 6 inch step on table, 1 x 10 touching down on to airex pad.   Manual Therapy:  N/A  Neuromuscular re-ed: Tandem walking FWD/ BWD 2 x 15 ft SLS with ball toss  Initially on airex pad but pt did stumble and have to sit down on the table that was behind her Modified to L SLS on floor 3 x 12 tosses and thrown at multiple angles with moderate postural sway noted.   Therapeutic Activity: N/A  Modalities: N/A  Self Care: N/A  Consider / progression for next session:                     PT Education - 08/21/21 0850     Education Details Reviewed HEP nd verbaly added pistol squat with L SLS focusing on lowering to chair    Person(s) Educated  Patient    Methods Explanation;Verbal cues;Handout    Comprehension Verbalized understanding;Verbal cues required              PT Short Term Goals - 07/09/21 3235       PT SHORT TERM GOAL #1   Title The patient will be independent in an initial ACLR home exercise program.    Period Weeks    Status Achieved      PT SHORT TERM GOAL #2   Title The patient will present with improvement of left knee flexion active motion to 90 degrees for transfers and ambulation.    Status Achieved      PT SHORT TERM GOAL #3   Title The patient will be able to perform 30 reps of left SLR without an extensor lag for transition to waking without an AD.    Period Weeks    Status Achieved               PT Long Term Goals - 08/14/21 0840       PT LONG TERM  GOAL #1   Title The patient will have an improved FOTO score to 69% or better for return to premorbid activities.    Baseline 34%    Time 12    Period Weeks    Status On-going      PT LONG TERM GOAL #2   Title The patient will be able to perform an eight inch step down with no compensation and good left knee eccentric control for stairs    Baseline step to pattern w/ crutches; 08/14/21- improving control with step down    Time 12    Period Weeks    Status On-going      PT LONG TERM GOAL #3   Title The patient will be able to perform a single leg stance on foam x 30 second or greater for return to community work with childern.    Baseline >30 sec on foam    Time 12    Period Weeks    Status Achieved      PT LONG TERM GOAL #4   Title The patient will be able to squat to full depth with equal weight bearing for lifting.    Baseline improving ROM left knee    Time 12    Period Weeks    Status On-going      PT LONG TERM GOAL #5   Title The patient will be able to complete a functonal hop test or Y balance testing and score a 90% or better for return to recreational and community activities.    Baseline not tested    Time 18    Period Weeks    Status Unable to assess                   Plan - 08/21/21 0811     Clinical Impression Statement pt arrives with no report of pain but does not some intermittent fatigue mostly with descending stairs. Today she is 12 weeks post op. progoressed to the elliptical today per clearnace of her protocol and continued working hamstring activation and quad utilzing CKC per protocol. She did well with exercises but did demonstrate increased postural sway with SLS balance. Overall she is doing very well and making great progress with PT.    PT Treatment/Interventions ADLs/Self Care Home Management;Aquatic Therapy;Cryotherapy;Electrical Stimulation;Moist Heat;Gait training;Stair training;Functional mobility training;Therapeutic  activities;Therapeutic exercise;Balance training;Neuromuscular re-education;Manual techniques;Patient/family education;Passive range of motion;Dry needling;Joint Manipulations;Spinal Manipulations;Taping  PT Next Visit Plan quad/ham strength- KB swings protocol , CKC, continue TRX, SLS    PT Home Exercise Plan Access Code: [email or web address] (updated new code due to old code not working)    Financial planner with Plan of Care Patient             Patient will benefit from skilled therapeutic intervention in order to improve the following deficits and impairments:  Abnormal gait, Decreased range of motion, Difficulty walking, Pain, Decreased balance, Impaired flexibility, Improper body mechanics, Decreased mobility, Decreased strength, Increased edema  Visit Diagnosis: Decreased range of motion of left knee  Acute pain of left knee  Difficulty in walking, not elsewhere classified  Muscle weakness (generalized)     Problem List Patient Active Problem List   Diagnosis Date Noted   BMI 27.0-27.9,adult     Lulu Riding PT, DPT, LAT, ATC  08/21/21  8:51 AM     [facility] Outpatient Rehabilitation [facility] [address] [place], [place], [address] Phone: [phone]   Fax:  [phone]  Name: Megan Bryan MRN: [id number] Date of Birth: July 29, 1981

## 2021-08-22 ENCOUNTER — Encounter: Payer: 59 | Admitting: Physical Therapy

## 2021-08-26 ENCOUNTER — Ambulatory Visit: Payer: 59 | Admitting: Physical Therapy

## 2021-08-26 ENCOUNTER — Other Ambulatory Visit: Payer: Self-pay

## 2021-08-26 DIAGNOSIS — M6281 Muscle weakness (generalized): Secondary | ICD-10-CM

## 2021-08-26 DIAGNOSIS — M25662 Stiffness of left knee, not elsewhere classified: Secondary | ICD-10-CM

## 2021-08-26 DIAGNOSIS — R262 Difficulty in walking, not elsewhere classified: Secondary | ICD-10-CM

## 2021-08-26 DIAGNOSIS — M25562 Pain in left knee: Secondary | ICD-10-CM

## 2021-08-26 NOTE — Therapy (Signed)
[facility] Outpatient Rehabilitation [facility] [address] [place], [place], [address] Phone: [phone]   Fax:  [phone]  Physical Therapy Treatment  Patient Details  Name: Megan Bryan MRN: [id number] Date of Birth: 07-17-1981 Referring Provider (PT): Bjorn Pippin, MD   Encounter Date: 08/26/2021   PT End of Session - 08/26/21 0723     Visit Number 14    Number of Visits 24    Date for PT Re-Evaluation 08/30/21    Authorization Type [employer]    PT Start Time 0715    PT Stop Time 0810    PT Time Calculation (min) 55 min             Past Medical History:  Diagnosis Date   BMI 27.0-27.9,adult     Past Surgical History:  Procedure Laterality Date   ANTERIOR CRUCIATE LIGAMENT REPAIR Left 06/05/2021   Procedure: ANTERIOR CRUCIATE LIGAMENT (ACL) REPAIR with patellar tendon graft;  Surgeon: Bjorn Pippin, MD;  Location: [facility];  Service: Orthopedics;  Laterality: Left;   KNEE ARTHROSCOPY WITH LATERAL MENISECTOMY Left 06/05/2021   Procedure: KNEE ARTHROSCOPY WITH LATERAL MENISECTOMY;  Surgeon: Bjorn Pippin, MD;  Location: [facility];  Service: Orthopedics;  Laterality: Left;   KNEE ARTHROSCOPY WITH MEDIAL MENISECTOMY Left 06/05/2021   Procedure: KNEE ARTHROSCOPY WITH MEDIAL MENISECTOMY;  Surgeon: Bjorn Pippin, MD;  Location: [facility];  Service: Orthopedics;  Laterality: Left;   TUBAL LIGATION      There were no vitals filed for this visit.   Subjective Assessment - 08/26/21 0722     Subjective No pain and was not very sore after  last session.    Currently in Pain? No/denies               Therapeutic Exercise: Elliptical L3 x 5 min ramp L1 Supine hip flexor stretch with strap  x 3 Prone quad stretch with strap x 3  Pistol squat L SLS, 2 x 10 touching down on to airex pad on mat table  Walking lung 2 x 10 with 2 -6# KB [place] eccentric hamstring 1 x 10 with bolster in  front to prevent going to the table     Slant board calf stretch 2 x 30   TRX bridge(Used Stability ball today)  with knee extendsed 1 x 12, with knees bent 1 x 12, alternating knee ext 1 x 12 with maintained bridge SL cone drill on oval pad x 10 6 inch step - heel strike to floor x 10 - no UE Standing quad and hamstring stretches   Manual Therapy:  N/A   Neuromuscular re-ed:  SLS with ball toss  L SLS on foam oval 3 x 12 tosses and thrown at multiple angles with moderate postural sway noted.   Modalities: Ice pack left knee       PT Short Term Goals - 07/09/21 4765       PT SHORT TERM GOAL #1   Title The patient will be independent in an initial ACLR home exercise program.    Period Weeks    Status Achieved      PT SHORT TERM GOAL #2   Title The patient will present with improvement of left knee flexion active motion to 90 degrees for transfers and ambulation.    Status Achieved      PT SHORT TERM GOAL #3   Title The patient will be able to perform 30 reps of left  SLR without an extensor lag for transition to waking without an AD.    Period Weeks    Status Achieved               PT Long Term Goals - 08/26/21 0815       PT LONG TERM GOAL #1   Title The patient will have an improved FOTO score to 69% or better for return to premorbid activities.    Baseline 34%    Time 12    Period Weeks    Status On-going      PT LONG TERM GOAL #2   Title The patient will be able to perform an eight inch step down with no compensation and good left knee eccentric control for stairs    Baseline step to pattern w/ crutches; 08/14/21- improving control with step down, 08/26/21: can perform 6 inch step down    Time 12    Period Weeks    Status On-going      PT LONG TERM GOAL #3   Title The patient will be able to perform a single leg stance on foam x 30 second or greater for return to community work with childern.    Baseline >30 sec on foam    Time 12    Period Weeks     Status Achieved      PT LONG TERM GOAL #4   Title The patient will be able to squat to full depth with equal weight bearing for lifting.    Baseline improving ROM left knee    Time 12    Period Weeks    Status On-going      PT LONG TERM GOAL #5   Title The patient will be able to complete a functonal hop test or Y balance testing and score a 90% or better for return to recreational and community activities.    Baseline not tested    Time 18    Period Weeks    Status On-going                   Plan - 08/26/21 0806     Clinical Impression Statement Pt arrives reporting min compliance with new pistol squat. Reviewed this and also instructed pt in bridge variation for hamstring exercises done with TRX at last appointment.Continued with SL stability and strength.  Pt tolerated the session well.    PT Treatment/Interventions ADLs/Self Care Home Management;Aquatic Therapy;Cryotherapy;Electrical Stimulation;Moist Heat;Gait training;Stair training;Functional mobility training;Therapeutic activities;Therapeutic exercise;Balance training;Neuromuscular re-education;Manual techniques;Patient/family education;Passive range of motion;Dry needling;Joint Manipulations;Spinal Manipulations;Taping    PT Next Visit Plan quad/ham strength- KB swings protocol , CKC, continue TRX hamstring curls/ bridge, SLS how is the pistol squat going?    PT Home Exercise Plan Access Code: [email or web address] (updated new code due to old code not working)             Patient will benefit from skilled therapeutic intervention in order to improve the following deficits and impairments:  Abnormal gait, Decreased range of motion, Difficulty walking, Pain, Decreased balance, Impaired flexibility, Improper body mechanics, Decreased mobility, Decreased strength, Increased edema  Visit Diagnosis: Decreased range of motion of left knee  Acute pain of left knee  Difficulty in walking, not elsewhere classified  Muscle  weakness (generalized)     Problem List Patient Active Problem List   Diagnosis Date Noted   BMI 27.0-27.9,adult     Sherrie Mustache, PTA 08/26/2021, 8:16 AM  [facility] [phone]  [address] [place], [place], [address] Phone: [phone]   Fax:  [phone]  Name: Megan Bryan MRN: [id number] Date of Birth: 05/31/1981

## 2021-08-27 ENCOUNTER — Encounter: Payer: 59 | Admitting: Physical Therapy

## 2021-08-28 ENCOUNTER — Ambulatory Visit: Payer: 59 | Admitting: Physical Therapy

## 2021-08-28 ENCOUNTER — Other Ambulatory Visit: Payer: Self-pay

## 2021-08-28 ENCOUNTER — Encounter: Payer: Self-pay | Admitting: Physical Therapy

## 2021-08-28 DIAGNOSIS — M25662 Stiffness of left knee, not elsewhere classified: Secondary | ICD-10-CM

## 2021-08-28 DIAGNOSIS — M6281 Muscle weakness (generalized): Secondary | ICD-10-CM

## 2021-08-28 DIAGNOSIS — M25562 Pain in left knee: Secondary | ICD-10-CM

## 2021-08-28 DIAGNOSIS — R262 Difficulty in walking, not elsewhere classified: Secondary | ICD-10-CM

## 2021-08-28 NOTE — Therapy (Signed)
[facility] [address] [place], [place], [address] Phone: [phone]   Fax:  [phone]  Physical Therapy Treatment / Re-certification  Patient Details  Name: Megan Bryan MRN: [id number] Date of Birth: Nov 09, 1980 Referring Provider (PT): Bjorn Pippin, MD   Encounter Date: 08/28/2021   PT End of Session - 08/28/21 0756     Visit Number 15    Number of Visits 31    Date for PT Re-Evaluation 10/23/21    Authorization Type [employer]    PT Start Time 0756    PT Stop Time 0846    PT Time Calculation (min) 50 min    Activity Tolerance Patient tolerated treatment well    Behavior During Therapy [facility] for tasks assessed/performed             Past Medical History:  Diagnosis Date   BMI 27.0-27.9,adult     Past Surgical History:  Procedure Laterality Date   ANTERIOR CRUCIATE LIGAMENT REPAIR Left 06/05/2021   Procedure: ANTERIOR CRUCIATE LIGAMENT (ACL) REPAIR with patellar tendon graft;  Surgeon: Bjorn Pippin, MD;  Location: [facility];  Service: Orthopedics;  Laterality: Left;   KNEE ARTHROSCOPY WITH LATERAL MENISECTOMY Left 06/05/2021   Procedure: KNEE ARTHROSCOPY WITH LATERAL MENISECTOMY;  Surgeon: Bjorn Pippin, MD;  Location: [facility];  Service: Orthopedics;  Laterality: Left;   KNEE ARTHROSCOPY WITH MEDIAL MENISECTOMY Left 06/05/2021   Procedure: KNEE ARTHROSCOPY WITH MEDIAL MENISECTOMY;  Surgeon: Bjorn Pippin, MD;  Location: [facility];  Service: Orthopedics;  Laterality: Left;   TUBAL LIGATION      There were no vitals filed for this visit.   Subjective Assessment - 08/28/21 0757     Subjective "I am doing okay, after the last visit I was pretty sore."    Patient Stated Goals To get back to being active, running, and playing basketball.    Currently in Pain? No/denies    Aggravating Factors  descending steps on both legs    Pain Relieving Factors rest                 [facility] PT Assessment - 08/28/21 0001       Observation/Other Assessments   Focus on Therapeutic Outcomes (FOTO)  67% limited      AROM   Left Knee Flexion 116      Strength   Strength Assessment Site Knee    Right/Left Knee Right;Left    Right Knee Flexion 5/5    Right Knee Extension 5/5    Left Knee Flexion 4/5    Left Knee Extension 4/5                      [facility] Adult PT Treatment/Exercise:  Therapeutic Exercise: Elliptical L1 x 5 min ramp L 10 Standing quad stretch 2 x 30  Standing calf stretch on slant board 2 x 30 Standing hamstring stretch 2 x 30  Step down LLE only 2 x 12 from - focus on 4 sec eccentric loading Cues to avoid leaning body forward during exercise [place] eccentric hamstring curl 1 x 10  Latera band walks 2 x 60 ft with GTB in Crossed over ankles/ hips sustained bridge with heels on Red physioball 2 x 15 hamstring curls Standing on the LLE on 4 inch step with RLE touching on to step/ floor 2 x 15  Manual Therapy:  N/A  Neuromuscular re-ed: NA  Therapeutic Activity: N/A  Modalities: N/A  Self Care: N/A  Consider / progression for next session:                 PT Short Term Goals - 08/28/21 0801       PT SHORT TERM GOAL #1   Title The patient will be independent in an initial ACLR home exercise program.    Period Weeks    Status Achieved      PT SHORT TERM GOAL #2   Title The patient will present with improvement of left knee flexion active motion to 90 degrees for transfers and ambulation.    Status Achieved      PT SHORT TERM GOAL #3   Title The patient will be able to perform 30 reps of left SLR without an extensor lag for transition to waking without an AD.    Status Achieved               PT Long Term Goals - 08/28/21 0801       PT LONG TERM GOAL #1   Title The patient will have an improved FOTO score to 69% or better for return to premorbid activities.    Period  Weeks    Status On-going      PT LONG TERM GOAL #2   Title The patient will be able to perform an eight inch step down with no compensation and good left knee eccentric control for stairs      PT LONG TERM GOAL #3   Title The patient will be able to perform a single leg stance on foam x 30 second or greater for return to community work with childern.    Period Weeks    Status Achieved      PT LONG TERM GOAL #4   Title The patient will be able to squat to full depth with equal weight bearing for lifting.    Period Weeks    Status On-going      PT LONG TERM GOAL #5   Title The patient will be able to complete a functonal hop test or Y balance testing and score a 90% or better for return to recreational and community activities.    Period Weeks    Status On-going                   Plan - 08/28/21 0816     Clinical Impression Statement Shizuko is making great progress with physical therapy increasing L knee ROM and strength but does continued to report stiffness in the L knee which is expected impactivint knee flexion ROM assessed at 116 degrees today. Continued progressing strengtheing staying in CKC per protocol allowance. She continues to do well with strengthening and is progressing appropriately. She would benefit from physical therapy to continue increasing strength, LE stability/ balance, work into high level activities and maximize her function by addressing the deficits listed.    Examination-Activity Limitations Bathing;Locomotion Level;Transfers;Bed Mobility;Caring for Others;Carry;Sleep;Dressing;Lift;Stand;Stairs;Squat    Examination-Participation Restrictions Cleaning;Church;Meal Prep;Occupation;Driving;Yard Work;Volunteer;Laundry;Shop;Community Activity    PT Frequency 2x / week    PT Duration 8 weeks    PT Treatment/Interventions ADLs/Self Care Home Management;Aquatic Therapy;Cryotherapy;Electrical Stimulation;Moist Heat;Gait training;Stair training;Functional  mobility training;Therapeutic activities;Therapeutic exercise;Balance training;Neuromuscular re-education;Manual techniques;Patient/family education;Passive range of motion;Dry needling;Joint Manipulations;Spinal Manipulations;Taping             Patient will benefit from skilled therapeutic intervention in order to improve the following deficits and impairments:  Abnormal gait, Decreased range of motion, Difficulty walking, Pain,  Decreased balance, Impaired flexibility, Improper body mechanics, Decreased mobility, Decreased strength, Increased edema  Visit Diagnosis: Decreased range of motion of left knee  Acute pain of left knee  Difficulty in walking, not elsewhere classified  Muscle weakness (generalized)     Problem List Patient Active Problem List   Diagnosis Date Noted   BMI 27.0-27.9,adult    Lulu Riding PT, DPT, LAT, ATC  08/28/21  9:06 AM       [facility] [address] [place], [place], [address] Phone: [phone]   Fax:  [phone]  Name: MAKIA BOSSI MRN: [id number] Date of Birth: 11/26/1980

## 2021-08-29 ENCOUNTER — Encounter: Payer: 59 | Admitting: Physical Therapy

## 2021-09-02 ENCOUNTER — Ambulatory Visit: Payer: 59 | Admitting: Physical Therapy

## 2021-09-02 ENCOUNTER — Other Ambulatory Visit: Payer: Self-pay

## 2021-09-02 ENCOUNTER — Encounter: Payer: Self-pay | Admitting: Physical Therapy

## 2021-09-02 DIAGNOSIS — M6281 Muscle weakness (generalized): Secondary | ICD-10-CM

## 2021-09-02 DIAGNOSIS — R262 Difficulty in walking, not elsewhere classified: Secondary | ICD-10-CM

## 2021-09-02 DIAGNOSIS — M25662 Stiffness of left knee, not elsewhere classified: Secondary | ICD-10-CM

## 2021-09-02 DIAGNOSIS — M25562 Pain in left knee: Secondary | ICD-10-CM

## 2021-09-02 NOTE — Therapy (Signed)
[facility] [address] [place], [place], [address] Phone: [phone]   Fax:  [phone]  Physical Therapy Treatment  Patient Details  Name: Megan Bryan MRN: [id number] Date of Birth: [birth year] Referring Provider (PT): Bjorn Pippin, MD   Encounter Date: 09/02/2021   PT End of Session - 09/02/21 0811     Visit Number 16    Number of Visits 31    Date for PT Re-Evaluation 10/23/21    Authorization Type [employer]    PT Start Time 0800    PT Stop Time 0855    PT Time Calculation (min) 55 min             Past Medical History:  Diagnosis Date   BMI 27.0-27.9,adult     Past Surgical History:  Procedure Laterality Date   ANTERIOR CRUCIATE LIGAMENT REPAIR Left 06/05/2021   Procedure: ANTERIOR CRUCIATE LIGAMENT (ACL) REPAIR with patellar tendon graft;  Surgeon: Bjorn Pippin, MD;  Location: [facility];  Service: Orthopedics;  Laterality: Left;   KNEE ARTHROSCOPY WITH LATERAL MENISECTOMY Left 06/05/2021   Procedure: KNEE ARTHROSCOPY WITH LATERAL MENISECTOMY;  Surgeon: Bjorn Pippin, MD;  Location: [facility];  Service: Orthopedics;  Laterality: Left;   KNEE ARTHROSCOPY WITH MEDIAL MENISECTOMY Left 06/05/2021   Procedure: KNEE ARTHROSCOPY WITH MEDIAL MENISECTOMY;  Surgeon: Bjorn Pippin, MD;  Location: [facility];  Service: Orthopedics;  Laterality: Left;   TUBAL LIGATION      There were no vitals filed for this visit.   Subjective Assessment - 09/02/21 0810     Subjective I was sore after last visit. No pain, just tight today.    Currently in Pain? No/denies             [facility] Adult PT Treatment/Exercise:   Therapeutic Exercise: Elliptical L1 x 5 min ramp L 10 Standing quad stretch 2 x 30  Standing calf stretch on slant board 2 x 30 Standing hamstring stretch 2 x 30  supine Cone drill L SLS x15, then on foam x 15 Step down 4 inch LLE only 2 x 12 from -  focus on 4 sec eccentric loading Cues to avoid leaning body forward during exercise [place] eccentric hamstring curl 1 x 10  Split squat 10 x 2 each Leg press 60# bilat, 40# single leg x5 then x 5 with concentric assist 10. Deadlift 25# x 20  11. Resisted gait forward and retro 27#  12. Hip flexor stretch with strap , prone quad with strap, supine hamstring with strap stretches    Manual Therapy:  N/A   Neuromuscular re-ed: NA   Therapeutic Activity: N/A   Modalities: Ice pack to left knee x 10 min   Self Care: N/A   Consider / progression for next session:                PT Short Term Goals - 08/28/21 0801       PT SHORT TERM GOAL #1   Title The patient will be independent in an initial ACLR home exercise program.    Period Weeks    Status Achieved      PT SHORT TERM GOAL #2   Title The patient will present with improvement of left knee flexion active motion to 90 degrees for transfers and ambulation.    Status Achieved      PT SHORT TERM GOAL #3   Title The patient will be able to  perform 30 reps of left SLR without an extensor lag for transition to waking without an AD.    Status Achieved               PT Long Term Goals - 08/28/21 0801       PT LONG TERM GOAL #1   Title The patient will have an improved FOTO score to 69% or better for return to premorbid activities.    Period Weeks    Status On-going      PT LONG TERM GOAL #2   Title The patient will be able to perform an eight inch step down with no compensation and good left knee eccentric control for stairs      PT LONG TERM GOAL #3   Title The patient will be able to perform a single leg stance on foam x 30 second or greater for return to community work with childern.    Period Weeks    Status Achieved      PT LONG TERM GOAL #4   Title The patient will be able to squat to full depth with equal weight bearing for lifting.    Period Weeks    Status On-going      PT LONG TERM  GOAL #5   Title The patient will be able to complete a functonal hop test or Y balance testing and score a 90% or better for return to recreational and community activities.    Period Weeks    Status On-going                   Plan - 09/02/21 0905     Clinical Impression Statement Improving single leg stability noted today. Continued with strengthening and began resisted gait with good tolerance.    PT Treatment/Interventions ADLs/Self Care Home Management;Aquatic Therapy;Cryotherapy;Electrical Stimulation;Moist Heat;Gait training;Stair training;Functional mobility training;Therapeutic activities;Therapeutic exercise;Balance training;Neuromuscular re-education;Manual techniques;Patient/family education;Passive range of motion;Dry needling;Joint Manipulations;Spinal Manipulations;Taping    PT Next Visit Plan quad/ham strength- KB swings protocol , CKC, continue TRX hamstring curls/ bridge, SLS how is the pistol squat going?    PT Home Exercise Plan Access Code: [email or web address] (updated new code due to old code not working)             Patient will benefit from skilled therapeutic intervention in order to improve the following deficits and impairments:  Abnormal gait, Decreased range of motion, Difficulty walking, Pain, Decreased balance, Impaired flexibility, Improper body mechanics, Decreased mobility, Decreased strength, Increased edema  Visit Diagnosis: Decreased range of motion of left knee  Acute pain of left knee  Muscle weakness (generalized)  Difficulty in walking, not elsewhere classified     Problem List Patient Active Problem List   Diagnosis Date Noted   BMI 27.0-27.9,adult     Sherrie Mustache, PTA 09/02/2021, 9:09 AM  [facility] [address] [place], [place], [address] Phone: [phone]   Fax:  [phone]  Name: Megan Bryan MRN: [id number] Date of Birth: 05-25-1981

## 2021-09-03 ENCOUNTER — Encounter: Payer: 59 | Admitting: Physical Therapy

## 2021-09-04 ENCOUNTER — Other Ambulatory Visit: Payer: Self-pay

## 2021-09-04 ENCOUNTER — Ambulatory Visit: Payer: 59 | Admitting: Physical Therapy

## 2021-09-04 ENCOUNTER — Encounter: Payer: Self-pay | Admitting: Physical Therapy

## 2021-09-04 DIAGNOSIS — M6281 Muscle weakness (generalized): Secondary | ICD-10-CM

## 2021-09-04 DIAGNOSIS — R262 Difficulty in walking, not elsewhere classified: Secondary | ICD-10-CM

## 2021-09-04 DIAGNOSIS — M25662 Stiffness of left knee, not elsewhere classified: Secondary | ICD-10-CM | POA: Diagnosis not present

## 2021-09-04 DIAGNOSIS — M25562 Pain in left knee: Secondary | ICD-10-CM

## 2021-09-04 NOTE — Therapy (Signed)
[facility] Outpatient [facility] [address] [place], [place], [address] Phone: [phone]   Fax:  [phone]  Physical Therapy Treatment  Patient Details  Name: Megan Bryan MRN: [id number] Date of Birth: April 23, 1981 Referring Provider (PT): Bjorn Pippin, MD   Encounter Date: 09/04/2021   PT End of Session - 09/04/21 0805     Visit Number 17    Number of Visits 31    Date for PT Re-Evaluation 10/23/21    Authorization Type [employer]    PT Start Time 0801    PT Stop Time 0843    PT Time Calculation (min) 42 min    Activity Tolerance Patient tolerated treatment well    Behavior During Therapy [facility] for tasks assessed/performed             Past Medical History:  Diagnosis Date   BMI 27.0-27.9,adult     Past Surgical History:  Procedure Laterality Date   ANTERIOR CRUCIATE LIGAMENT REPAIR Left 06/05/2021   Procedure: ANTERIOR CRUCIATE LIGAMENT (ACL) REPAIR with patellar tendon graft;  Surgeon: Bjorn Pippin, MD;  Location: [facility];  Service: Orthopedics;  Laterality: Left;   KNEE ARTHROSCOPY WITH LATERAL MENISECTOMY Left 06/05/2021   Procedure: KNEE ARTHROSCOPY WITH LATERAL MENISECTOMY;  Surgeon: Bjorn Pippin, MD;  Location: [facility];  Service: Orthopedics;  Laterality: Left;   KNEE ARTHROSCOPY WITH MEDIAL MENISECTOMY Left 06/05/2021   Procedure: KNEE ARTHROSCOPY WITH MEDIAL MENISECTOMY;  Surgeon: Bjorn Pippin, MD;  Location: [facility];  Service: Orthopedics;  Laterality: Left;   TUBAL LIGATION      There were no vitals filed for this visit.   Subjective Assessment - 09/04/21 0804     Subjective "still have some swelling, no pain just some tightness from time to time."    Patient Stated Goals To get back to being active, running, and playing basketball.    Currently in Pain? No/denies                                 [facility] Adult PT  Treatment/Exercise:  Therapeutic Exercise: Ellipitical L6 x 6 min Ramp L10 Standing  calf stretch 2 x 30 sec on slant board Standing TRX lunge 4 x 12 alternating lead leg every other set Hamstring curl using machine, LLE only 3 x 12 15# Lateral stepping with purple band around hips 2 x 60 ft Forward walking with purple band around the hips 4 x 60 ft Standing hamstring curl 2 x 20 LLE 4# Standing on the LLE on 6 inch step touch toes on R foot from step to floor. With purple band around the hips 1 set going to fatigue   Manual Therapy:  Tibiofemoral distraction with knee flexion working into end range Seated on hi-lo table DTM along the quad   Neuromuscular re-ed: N/A  Therapeutic Activity: N/A  Modalities: N/A  Self Care: Reviewed continued stretching and STW using roller along the quad. Benefits of using ice following exercises to help reduce muscle soreness.  Consider / progression for next session:          PT Short Term Goals - 08/28/21 0801       PT SHORT TERM GOAL #1   Title The patient will be independent in an initial ACLR home exercise program.    Period Weeks    Status Achieved      PT SHORT TERM  GOAL #2   Title The patient will present with improvement of left knee flexion active motion to 90 degrees for transfers and ambulation.    Status Achieved      PT SHORT TERM GOAL #3   Title The patient will be able to perform 30 reps of left SLR without an extensor lag for transition to waking without an AD.    Status Achieved               PT Long Term Goals - 08/28/21 0801       PT LONG TERM GOAL #1   Title The patient will have an improved FOTO score to 69% or better for return to premorbid activities.    Period Weeks    Status On-going      PT LONG TERM GOAL #2   Title The patient will be able to perform an eight inch step down with no compensation and good left knee eccentric control for stairs      PT LONG TERM GOAL #3   Title The  patient will be able to perform a single leg stance on foam x 30 second or greater for return to community work with childern.    Period Weeks    Status Achieved      PT LONG TERM GOAL #4   Title The patient will be able to squat to full depth with equal weight bearing for lifting.    Period Weeks    Status On-going      PT LONG TERM GOAL #5   Title The patient will be able to complete a functonal hop test or Y balance testing and score a 90% or better for return to recreational and community activities.    Period Weeks    Status On-going                   Plan - 09/04/21 3716     Clinical Impression Statement pt is 13 weeks post op today. Continued working on strengtheing of the hip's/ knee styaing within protocol limitations with CKC. She noted reduced stiffness following manual techniqes for the knee ROM and STW along the quad. She did well with all exercises today noting soreness but overall felt better.    PT Treatment/Interventions ADLs/Self Care Home Management;Aquatic Therapy;Cryotherapy;Electrical Stimulation;Moist Heat;Gait training;Stair training;Functional mobility training;Therapeutic activities;Therapeutic exercise;Balance training;Neuromuscular re-education;Manual techniques;Patient/family education;Passive range of motion;Dry needling;Joint Manipulations;Spinal Manipulations;Taping    PT Next Visit Plan quad/ham strength- KB swings protocol , CKC, continue TRX hamstring curls/ bridge, SLS how is the pistol squat going    PT Home Exercise Plan Access Code: [email or web address] (updated new code due to old code not working)             Patient will benefit from skilled therapeutic intervention in order to improve the following deficits and impairments:  Abnormal gait, Decreased range of motion, Difficulty walking, Pain, Decreased balance, Impaired flexibility, Improper body mechanics, Decreased mobility, Decreased strength, Increased edema  Visit Diagnosis: Decreased range  of motion of left knee  Acute pain of left knee  Muscle weakness (generalized)  Difficulty in walking, not elsewhere classified     Problem List Patient Active Problem List   Diagnosis Date Noted   BMI 27.0-27.9,adult    Lulu Riding PT, DPT, LAT, ATC  09/04/21  8:45 AM      [facility] [address] [place], [place], [address] Phone: [phone]   Fax:  [phone]  Name: Megan Eveland  Bryan MRN: [id number] Date of Birth: 10/06/1981

## 2021-09-05 ENCOUNTER — Encounter: Payer: 59 | Admitting: Physical Therapy

## 2021-09-09 ENCOUNTER — Ambulatory Visit: Payer: 59 | Admitting: Physical Therapy

## 2021-09-10 ENCOUNTER — Encounter: Payer: 59 | Admitting: Physical Therapy

## 2021-09-11 ENCOUNTER — Ambulatory Visit: Payer: 59 | Admitting: Physical Therapy

## 2021-09-11 ENCOUNTER — Other Ambulatory Visit: Payer: Self-pay

## 2021-09-11 ENCOUNTER — Encounter: Payer: Self-pay | Admitting: Physical Therapy

## 2021-09-11 DIAGNOSIS — M25562 Pain in left knee: Secondary | ICD-10-CM

## 2021-09-11 DIAGNOSIS — M6281 Muscle weakness (generalized): Secondary | ICD-10-CM

## 2021-09-11 DIAGNOSIS — R262 Difficulty in walking, not elsewhere classified: Secondary | ICD-10-CM

## 2021-09-11 DIAGNOSIS — M25662 Stiffness of left knee, not elsewhere classified: Secondary | ICD-10-CM | POA: Diagnosis not present

## 2021-09-11 NOTE — Therapy (Signed)
[facility] Outpatient Rehabilitation [facility] [address] [place], [place], [address] Phone: [phone]   Fax:  [phone]  Physical Therapy Treatment  Patient Details  Name: Megan Bryan MRN: [id number] Date of Birth: June 14, 1981 Referring Provider (PT): Bjorn Pippin, MD   Encounter Date: 09/11/2021   PT End of Session - 09/11/21 0800     Visit Number 18    Number of Visits 31    Date for PT Re-Evaluation 10/23/21    Authorization Type [employer]    PT Start Time 0800    PT Stop Time 0844    PT Time Calculation (min) 44 min    Activity Tolerance Patient tolerated treatment well    Behavior During Therapy [facility] for tasks assessed/performed             Past Medical History:  Diagnosis Date   BMI 27.0-27.9,adult     Past Surgical History:  Procedure Laterality Date   ANTERIOR CRUCIATE LIGAMENT REPAIR Left 06/05/2021   Procedure: ANTERIOR CRUCIATE LIGAMENT (ACL) REPAIR with patellar tendon graft;  Surgeon: Bjorn Pippin, MD;  Location: [facility];  Service: Orthopedics;  Laterality: Left;   KNEE ARTHROSCOPY WITH LATERAL MENISECTOMY Left 06/05/2021   Procedure: KNEE ARTHROSCOPY WITH LATERAL MENISECTOMY;  Surgeon: Bjorn Pippin, MD;  Location: [facility];  Service: Orthopedics;  Laterality: Left;   KNEE ARTHROSCOPY WITH MEDIAL MENISECTOMY Left 06/05/2021   Procedure: KNEE ARTHROSCOPY WITH MEDIAL MENISECTOMY;  Surgeon: Bjorn Pippin, MD;  Location:  S[facility];  Service: Orthopedics;  Laterality: Left;   TUBAL LIGATION      There were no vitals filed for this visit.   Subjective Assessment - 09/11/21 0801     Subjective "since I wasn't able to make it last session due to provider being out I went to gym and did what I thought you guys would do."    Patient Stated Goals To get back to being active, running, and playing basketball.    Currently in Pain? No/denies                               [facility] Adult PT Treatment/Exercise:  Therapeutic Exercise: Elliptical L6, Ramp L6 FWD/ BWd x 3 min ea Standing calf stretch on slant board Standing hamstring stretch and quad stretch Seated stool scoots 4 x 60 ft LLE only [place] split squat 2 x 10 standing on LLE Sidelying hip sustained abduction with CW/CCW 4# 2 x 20 ea SLR 1 x 20 with 4# LLE only Star lunge standing on LLE 1 x 5  Manual Therapy:  N/A  Neuromuscular re-ed: Standing on bosu with LLE alternating RLE from floor to bosu going to fatigue  Therapeutic Activity: Pre-jumping activity heel raised into squat 2 x 10  Mirror for visual feedback and verbal cues / demonstration  Modalities: N/A  Self Care: N/A  Consider / progression for next session:              PT Short Term Goals - 08/28/21 0801       PT SHORT TERM GOAL #1   Title The patient will be independent in an initial ACLR home exercise program.    Period Weeks    Status Achieved      PT SHORT TERM GOAL #2   Title The patient will present with improvement of left knee flexion active motion to 90 degrees for transfers and ambulation.  Status Achieved      PT SHORT TERM GOAL #3   Title The patient will be able to perform 30 reps of left SLR without an extensor lag for transition to waking without an AD.    Status Achieved               PT Long Term Goals - 08/28/21 0801       PT LONG TERM GOAL #1   Title The patient will have an improved FOTO score to 69% or better for return to premorbid activities.    Period Weeks    Status On-going      PT LONG TERM GOAL #2   Title The patient will be able to perform an eight inch step down with no compensation and good left knee eccentric control for stairs      PT LONG TERM GOAL #3   Title The patient will be able to perform a single leg stance on foam x 30 second or greater for return to community work with childern.    Period Weeks    Status  Achieved      PT LONG TERM GOAL #4   Title The patient will be able to squat to full depth with equal weight bearing for lifting.    Period Weeks    Status On-going      PT LONG TERM GOAL #5   Title The patient will be able to complete a functonal hop test or Y balance testing and score a 90% or better for return to recreational and community activities.    Period Weeks    Status On-going                   Plan - 09/11/21 0844     Clinical Impression Statement [name] is 15 weeks today and continues to do very well with PT. She does report some stiffness in the knee but denies any pain or issues. Continued working on gross LLE strengthening. began pre-jumping exercise maintain CKC per protocol allowance, which she did well with but did required visual cues for proper form. End of session she noted feeling like she was worked but overall was still doing well.    PT Treatment/Interventions ADLs/Self Care Home Management;Aquatic Therapy;Cryotherapy;Electrical Stimulation;Moist Heat;Gait training;Stair training;Functional mobility training;Therapeutic activities;Therapeutic exercise;Balance training;Neuromuscular re-education;Manual techniques;Patient/family education;Passive range of motion;Dry needling;Joint Manipulations;Spinal Manipulations;Taping    PT Next Visit Plan quad/ham strength- KB swings protocol , CKC, continue TRX hamstring curls/ bridge, SLS how is the pistol squat going    PT Home Exercise Plan Access Code: [email or web address] (updated new code due to old code not working)    Financial planner with Plan of Care Patient             Patient will benefit from skilled therapeutic intervention in order to improve the following deficits and impairments:  Abnormal gait, Decreased range of motion, Difficulty walking, Pain, Decreased balance, Impaired flexibility, Improper body mechanics, Decreased mobility, Decreased strength, Increased edema  Visit Diagnosis: Decreased range of  motion of left knee  Acute pain of left knee  Muscle weakness (generalized)  Difficulty in walking, not elsewhere classified     Problem List Patient Active Problem List   Diagnosis Date Noted   BMI 27.0-27.9,adult    Lulu Riding PT, DPT, LAT, ATC  09/11/21  8:47 AM     [facility] Outpatient Rehabilitation [facility] [address] [place], [place], [address] Phone: [phone]   Fax:  [phone]  Name: DWANA GARIN MRN: [id number] Date of Birth: May 31, 1981

## 2021-09-16 ENCOUNTER — Ambulatory Visit: Payer: 59 | Admitting: Physical Therapy

## 2021-09-16 ENCOUNTER — Encounter: Payer: Self-pay | Admitting: Physical Therapy

## 2021-09-16 ENCOUNTER — Other Ambulatory Visit: Payer: Self-pay

## 2021-09-16 DIAGNOSIS — R262 Difficulty in walking, not elsewhere classified: Secondary | ICD-10-CM

## 2021-09-16 DIAGNOSIS — M6281 Muscle weakness (generalized): Secondary | ICD-10-CM

## 2021-09-16 DIAGNOSIS — M25662 Stiffness of left knee, not elsewhere classified: Secondary | ICD-10-CM

## 2021-09-16 DIAGNOSIS — M25562 Pain in left knee: Secondary | ICD-10-CM

## 2021-09-16 NOTE — Therapy (Signed)
[facility] Outpatient Rehabilitation [facility] [address] [place], [place], [address] Phone: [phone]   Fax:  [phone]  Physical Therapy Treatment  Patient Details  Name: Megan Bryan MRN: [id number] Date of Birth: 1981/05/21 Referring Provider (PT): Bjorn Pippin, MD   Encounter Date: 09/16/2021   PT End of Session - 09/16/21 0727     Visit Number 19    Number of Visits 31    Date for PT Re-Evaluation 10/23/21    Authorization Type [employer]    PT Start Time 0715    PT Stop Time 0800    PT Time Calculation (min) 45 min             Past Medical History:  Diagnosis Date   BMI 27.0-27.9,adult     Past Surgical History:  Procedure Laterality Date   ANTERIOR CRUCIATE LIGAMENT REPAIR Left 06/05/2021   Procedure: ANTERIOR CRUCIATE LIGAMENT (ACL) REPAIR with patellar tendon graft;  Surgeon: Bjorn Pippin, MD;  Location: [facility];  Service: Orthopedics;  Laterality: Left;   KNEE ARTHROSCOPY WITH LATERAL MENISECTOMY Left 06/05/2021   Procedure: KNEE ARTHROSCOPY WITH LATERAL MENISECTOMY;  Surgeon: Bjorn Pippin, MD;  Location: [facility];  Service: Orthopedics;  Laterality: Left;   KNEE ARTHROSCOPY WITH MEDIAL MENISECTOMY Left 06/05/2021   Procedure: KNEE ARTHROSCOPY WITH MEDIAL MENISECTOMY;  Surgeon: Bjorn Pippin, MD;  Location: [facility];  Service: Orthopedics;  Laterality: Left;   TUBAL LIGATION      There were no vitals filed for this visit.   Subjective Assessment - 09/16/21 0718     Subjective No pain or soreness this morning. I took a break over the holiday.    Currently in Pain? No/denies             [facility] Adult PT Treatment/Exercise:  Therapeutic Exercise: Elliptical L6, Ramp L6 FWD/ BWd x 3 min ea Standing calf stretch on slant board Standing hamstring stretch and quad stretch Seated stool scoots 4 x 60 ft LLE only [place] split squat 2 x 10 standing on LLE Sidelying  hip sustained abduction with CW/CCW 4# 2 x 20 ea SLR 1 x 20 with 4# LLE only Star lunge standing on LLE 1 x 5  Manual Therapy:  N/A  Neuromuscular re-ed: Standing on bosu with LLE alternating RLE from floor to bosu going to fatigue  Therapeutic Activity: Pre-jumping activity heel raised into squat 2 x 10  Mirror for visual feedback and verbal cues / demonstration  Modalities: N/A  Self Care: N/A  Consider / progression for next session:                             PT Short Term Goals - 08/28/21 0801       PT SHORT TERM GOAL #1   Title The patient will be independent in an initial ACLR home exercise program.    Period Weeks    Status Achieved      PT SHORT TERM GOAL #2   Title The patient will present with improvement of left knee flexion active motion to 90 degrees for transfers and ambulation.    Status Achieved      PT SHORT TERM GOAL #3   Title The patient will be able to perform 30 reps of left SLR without an extensor lag for transition to waking without an AD.    Status Achieved  PT Long Term Goals - 08/28/21 0801       PT LONG TERM GOAL #1   Title The patient will have an improved FOTO score to 69% or better for return to premorbid activities.    Period Weeks    Status On-going      PT LONG TERM GOAL #2   Title The patient will be able to perform an eight inch step down with no compensation and good left knee eccentric control for stairs      PT LONG TERM GOAL #3   Title The patient will be able to perform a single leg stance on foam x 30 second or greater for return to community work with childern.    Period Weeks    Status Achieved      PT LONG TERM GOAL #4   Title The patient will be able to squat to full depth with equal weight bearing for lifting.    Period Weeks    Status On-going      PT LONG TERM GOAL #5   Title The patient will be able to complete a functonal hop test or Y balance testing and  score a 90% or better for return to recreational and community activities.    Period Weeks    Status On-going                   Plan - 09/16/21 0724     Clinical Impression Statement Megan Bryan is nearly 16 weeks s/p surgery and reports no pain or soreness on arrival. Continued with exercise progression per previous visit, including pre-plyometrics and single leg challenges. She tolerated session well with fatigue, no increased pain.    PT Treatment/Interventions ADLs/Self Care Home Management;Aquatic Therapy;Cryotherapy;Electrical Stimulation;Moist Heat;Gait training;Stair training;Functional mobility training;Therapeutic activities;Therapeutic exercise;Balance training;Neuromuscular re-education;Manual techniques;Patient/family education;Passive range of motion;Dry needling;Joint Manipulations;Spinal Manipulations;Taping    PT Next Visit Plan quad/ham strength- KB swings protocol , CKC, continue TRX hamstring curls/ bridge, SLS how is the pistol squat going    PT Home Exercise Plan Access Code: [email or web address] (updated new code due to old code not working)             Patient will benefit from skilled therapeutic intervention in order to improve the following deficits and impairments:  Abnormal gait, Decreased range of motion, Difficulty walking, Pain, Decreased balance, Impaired flexibility, Improper body mechanics, Decreased mobility, Decreased strength, Increased edema  Visit Diagnosis: Decreased range of motion of left knee  Acute pain of left knee  Muscle weakness (generalized)  Difficulty in walking, not elsewhere classified     Problem List Patient Active Problem List   Diagnosis Date Noted   BMI 27.0-27.9,adult     Sherrie Mustache, PTA 09/16/2021, 8:08 AM  [facility] [address] [place], [place], [address] Phone: [phone]   Fax:  [phone]  Name: Megan Bryan MRN: [id number] Date of  Birth: 08-06-1981

## 2021-09-17 ENCOUNTER — Encounter: Payer: 59 | Admitting: Physical Therapy

## 2021-09-17 ENCOUNTER — Encounter: Payer: Self-pay | Admitting: Physical Therapy

## 2021-09-17 ENCOUNTER — Ambulatory Visit: Payer: 59 | Admitting: Physical Therapy

## 2021-09-17 DIAGNOSIS — M6281 Muscle weakness (generalized): Secondary | ICD-10-CM

## 2021-09-17 DIAGNOSIS — R262 Difficulty in walking, not elsewhere classified: Secondary | ICD-10-CM

## 2021-09-17 DIAGNOSIS — M25662 Stiffness of left knee, not elsewhere classified: Secondary | ICD-10-CM

## 2021-09-17 DIAGNOSIS — M25562 Pain in left knee: Secondary | ICD-10-CM

## 2021-09-17 NOTE — Therapy (Signed)
[facility] Outpatient Rehabilitation [facility] [address] [place], [place], [address] Phone: [phone]   Fax:  [phone]  Physical Therapy Treatment  Patient Details  Name: Megan Bryan MRN: [id number] Date of Birth: 03-25-1981 Referring Provider (PT): Bjorn Pippin, MD   Encounter Date: 09/17/2021   PT End of Session - 09/17/21 0804     Visit Number 20    Number of Visits 31    Date for PT Re-Evaluation 10/23/21    Authorization Type [employer]    PT Start Time 0803    PT Stop Time 0848    PT Time Calculation (min) 45 min    Activity Tolerance Patient tolerated treatment well    Behavior During Therapy [facility] for tasks assessed/performed             Past Medical History:  Diagnosis Date   BMI 27.0-27.9,adult     Past Surgical History:  Procedure Laterality Date   ANTERIOR CRUCIATE LIGAMENT REPAIR Left 06/05/2021   Procedure: ANTERIOR CRUCIATE LIGAMENT (ACL) REPAIR with patellar tendon graft;  Surgeon: Bjorn Pippin, MD;  Location: [facility];  Service: Orthopedics;  Laterality: Left;   KNEE ARTHROSCOPY WITH LATERAL MENISECTOMY Left 06/05/2021   Procedure: KNEE ARTHROSCOPY WITH LATERAL MENISECTOMY;  Surgeon: Bjorn Pippin, MD;  Location: [facility];  Service: Orthopedics;  Laterality: Left;   KNEE ARTHROSCOPY WITH MEDIAL MENISECTOMY Left 06/05/2021   Procedure: KNEE ARTHROSCOPY WITH MEDIAL MENISECTOMY;  Surgeon: Bjorn Pippin, MD;  Location: [facility];  Service: Orthopedics;  Laterality: Left;   TUBAL LIGATION      There were no vitals filed for this visit.   Subjective Assessment - 09/17/21 0804     Subjective "no issues today."    Patient Stated Goals To get back to being active, running, and playing basketball.    Aggravating Factors  N/A    Pain Relieving Factors N/A                           [facility] Adult PT Treatment/Exercise:  Therapeutic Exercise: Stepper L2  x 5 min  Quad stretch in thomas test pos (contract/ relax) 3 x 30 Supine hamstring stretch 2 x 30 sec contract/ relax Calf stretch on slant board 2 x 30  Side plank 2 x 5 holding 20 secs Tall plank 5 x 20 sec Dead bug with alternating contralateral 5 x 20 reps Red physioball on stomach to help with form- modified to posterior pelvic tilt and static dead bug due to trouble with not arching her back requiring verbal and visual cues for proper form   Manual Therapy:  N/A  Neuromuscular re-ed: SLS on airex 2 x 30 seconds SLES on airex pad with L/R alternating cone taps 2 x 20, increasing distance to cone between sets 1/2 kneeling tandem balance which 1 x 30 seconds static hold bil with min postural sway noted.  1/2 kneeling tandem balance progressed to 1 x 20 head turns  Therapeutic Activity: N/A  Modalities: N/A  Self Care: N/A  Consider / progression for next session:              PT Education - 09/17/21 0855     Education Details updated HEP to include core strengthening    Person(s) Educated Patient    Methods Explanation;Verbal cues;Handout    Comprehension Verbalized understanding;Verbal cues required  PT Short Term Goals - 08/28/21 0801       PT SHORT TERM GOAL #1   Title The patient will be independent in an initial ACLR home exercise program.    Period Weeks    Status Achieved      PT SHORT TERM GOAL #2   Title The patient will present with improvement of left knee flexion active motion to 90 degrees for transfers and ambulation.    Status Achieved      PT SHORT TERM GOAL #3   Title The patient will be able to perform 30 reps of left SLR without an extensor lag for transition to waking without an AD.    Status Achieved               PT Long Term Goals - 08/28/21 0801       PT LONG TERM GOAL #1   Title The patient will have an improved FOTO score to 69% or better for return to premorbid activities.    Period Weeks     Status On-going      PT LONG TERM GOAL #2   Title The patient will be able to perform an eight inch step down with no compensation and good left knee eccentric control for stairs      PT LONG TERM GOAL #3   Title The patient will be able to perform a single leg stance on foam x 30 second or greater for return to community work with childern.    Period Weeks    Status Achieved      PT LONG TERM GOAL #4   Title The patient will be able to squat to full depth with equal weight bearing for lifting.    Period Weeks    Status On-going      PT LONG TERM GOAL #5   Title The patient will be able to complete a functonal hop test or Y balance testing and score a 90% or better for return to recreational and community activities.    Period Weeks    Status On-going                   Plan - 09/17/21 7026     Clinical Impression Statement pt arrived to session reporting no pain. focused today's session gross core strengthening. She did very well with strengthening of her core requring min cues for proper form. Her balance is improving but demonstrated increased postural sway with 1/2 kneeling requiring Contact-guard intermittently.    PT Treatment/Interventions ADLs/Self Care Home Management;Aquatic Therapy;Cryotherapy;Electrical Stimulation;Moist Heat;Gait training;Stair training;Functional mobility training;Therapeutic activities;Therapeutic exercise;Balance training;Neuromuscular re-education;Manual techniques;Patient/family education;Passive range of motion;Dry needling;Joint Manipulations;Spinal Manipulations;Taping    PT Next Visit Plan quad/ham strength- KB swings protocol , CKC, continue TRX hamstring curls/ bridge, SLS how is the pistol squat going    PT Home Exercise Plan Access Code: [email or web address] (updated new code due to old code not working)    Financial planner with Plan of Care Patient             Patient will benefit from skilled therapeutic intervention in order to improve  the following deficits and impairments:  Abnormal gait, Decreased range of motion, Difficulty walking, Pain, Decreased balance, Impaired flexibility, Improper body mechanics, Decreased mobility, Decreased strength, Increased edema  Visit Diagnosis: No diagnosis found.     Problem List Patient Active Problem List   Diagnosis Date Noted   BMI 27.0-27.9,adult    Abeer Deskins PT, DPT, LAT, ATC  09/17/21  8:58 AM      [facility] Outpatient Rehabilitation [facility] [address] [place], [place], [address] Phone: [phone]   Fax:  [phone]  Name: Megan Bryan MRN: [id number] Date of Birth: 1981/06/22

## 2021-09-18 ENCOUNTER — Encounter: Payer: 59 | Admitting: Physical Therapy

## 2021-09-19 ENCOUNTER — Encounter: Payer: 59 | Admitting: Physical Therapy

## 2021-09-23 ENCOUNTER — Encounter: Payer: 59 | Admitting: Physical Therapy

## 2021-09-25 ENCOUNTER — Other Ambulatory Visit: Payer: Self-pay

## 2021-09-25 ENCOUNTER — Ambulatory Visit: Payer: 59 | Attending: Orthopaedic Surgery | Admitting: Physical Therapy

## 2021-09-25 ENCOUNTER — Encounter: Payer: Self-pay | Admitting: Physical Therapy

## 2021-09-25 DIAGNOSIS — M25662 Stiffness of left knee, not elsewhere classified: Secondary | ICD-10-CM | POA: Diagnosis not present

## 2021-09-25 DIAGNOSIS — R262 Difficulty in walking, not elsewhere classified: Secondary | ICD-10-CM

## 2021-09-25 DIAGNOSIS — M25562 Pain in left knee: Secondary | ICD-10-CM

## 2021-09-25 DIAGNOSIS — M6281 Muscle weakness (generalized): Secondary | ICD-10-CM | POA: Diagnosis present

## 2021-09-25 NOTE — Therapy (Signed)
[facility] Outpatient Rehabilitation [facility] [address] [place], [place], [address] Phone: [phone]   Fax:  [phone]  Physical Therapy Treatment  Patient Details  Name: Megan Bryan MRN: [id number] Date of Birth: [birth year] Referring Provider (PT): Bjorn Pippin, MD   Encounter Date: 09/25/2021   PT End of Session - 09/25/21 0803     Visit Number 21    Number of Visits 31    Date for PT Re-Evaluation 10/23/21    Authorization Type [employer]    PT Start Time 0803    PT Stop Time 0845    PT Time Calculation (min) 42 min    Activity Tolerance Patient tolerated treatment well    Behavior During Therapy [facility] for tasks assessed/performed             Past Medical History:  Diagnosis Date   BMI 27.0-27.9,adult     Past Surgical History:  Procedure Laterality Date   ANTERIOR CRUCIATE LIGAMENT REPAIR Left 06/05/2021   Procedure: ANTERIOR CRUCIATE LIGAMENT (ACL) REPAIR with patellar tendon graft;  Surgeon: Bjorn Pippin, MD;  Location: [facility];  Service: Orthopedics;  Laterality: Left;   KNEE ARTHROSCOPY WITH LATERAL MENISECTOMY Left 06/05/2021   Procedure: KNEE ARTHROSCOPY WITH LATERAL MENISECTOMY;  Surgeon: Bjorn Pippin, MD;  Location: [facility];  Service: Orthopedics;  Laterality: Left;   KNEE ARTHROSCOPY WITH MEDIAL MENISECTOMY Left 06/05/2021   Procedure: KNEE ARTHROSCOPY WITH MEDIAL MENISECTOMY;  Surgeon: Bjorn Pippin, MD;  Location: [facility];  Service: Orthopedics;  Laterality: Left;   TUBAL LIGATION      There were no vitals filed for this visit.   Subjective Assessment - 09/25/21 0808     Subjective " I saw the MD and he is pleased with my progress and states I am farther along and would be able to start interval jogging."    Currently in Pain? No/denies                             [facility] Adult PT Treatment/Exercise:  Therapeutic Exercise: Elliptical L5 ,  ramp L5 x 5 min  Quad and hamstring stretch standing 2 x 30 sec  Trailed quad stretch in kneeling sitting back onto heels 2 x 30  Glute med stretching in R sidelying 1 x 30 sec Modified to standing for 2nd set against the wall Alternating toe taps with RLE while standing on bosu with LLE 2 x 20   Manual Therapy:  MTPR along the vastus lateralis and glute med  Neuromuscular re-ed: N/A  Therapeutic Activity: Light jogging 4 x 60 ft 20% max speed  Jogging in place in front of mirror 3 x 30 sec Alternating toe taps on 4 inch step 3 x 20 sec   Modalities: N/A  Self Care: N/A  Consider / progression for next session:              PT Short Term Goals - 08/28/21 0801       PT SHORT TERM GOAL #1   Title The patient will be independent in an initial ACLR home exercise program.    Period Weeks    Status Achieved      PT SHORT TERM GOAL #2   Title The patient will present with improvement of left knee flexion active motion to 90 degrees for transfers and ambulation.    Status Achieved      PT  SHORT TERM GOAL #3   Title The patient will be able to perform 30 reps of left SLR without an extensor lag for transition to waking without an AD.    Status Achieved               PT Long Term Goals - 08/28/21 0801       PT LONG TERM GOAL #1   Title The patient will have an improved FOTO score to 69% or better for return to premorbid activities.    Period Weeks    Status On-going      PT LONG TERM GOAL #2   Title The patient will be able to perform an eight inch step down with no compensation and good left knee eccentric control for stairs      PT LONG TERM GOAL #3   Title The patient will be able to perform a single leg stance on foam x 30 second or greater for return to community work with childern.    Period Weeks    Status Achieved      PT LONG TERM GOAL #4   Title The patient will be able to squat to full depth with equal weight bearing for lifting.     Period Weeks    Status On-going      PT LONG TERM GOAL #5   Title The patient will be able to complete a functonal hop test or Y balance testing and score a 90% or better for return to recreational and community activities.    Period Weeks    Status On-going                   Plan - 09/25/21 0913     Clinical Impression Statement Pt arrives to session rpeorting she saw her MD and that she is doing well and is cleared to begin forward jogging. worked on STW along the glute med and vastus lateralis with MTPR techinques, followed with stretching which she did well with. Started forward light jogging in a straight line which she did well but exhibited some compensation likely from apprehension. continued working on gross LLE strengthening working into endurance. Plan to continue with fucntional progression of strengthening.    PT Treatment/Interventions ADLs/Self Care Home Management;Aquatic Therapy;Cryotherapy;Electrical Stimulation;Moist Heat;Gait training;Stair training;Functional mobility training;Therapeutic activities;Therapeutic exercise;Balance training;Neuromuscular re-education;Manual techniques;Patient/family education;Passive range of motion;Dry needling;Joint Manipulations;Spinal Manipulations;Taping    PT Next Visit Plan quad/ham strength- KB swings protocol , CKC, continue TRX hamstring curls/ bridge, SLS how is the pistol squat going    PT Home Exercise Plan Access Code: [email or web address] (updated new code due to old code not working)    Financial planner with Plan of Care Patient             Patient will benefit from skilled therapeutic intervention in order to improve the following deficits and impairments:  Abnormal gait, Decreased range of motion, Difficulty walking, Pain, Decreased balance, Impaired flexibility, Improper body mechanics, Decreased mobility, Decreased strength, Increased edema  Visit Diagnosis: Decreased range of motion of left knee  Acute pain of left  knee  Muscle weakness (generalized)  Difficulty in walking, not elsewhere classified     Problem List Patient Active Problem List   Diagnosis Date Noted   BMI 27.0-27.9,adult     Lulu Riding PT, DPT, LAT, ATC  09/25/21  9:24 AM      [facility] Outpatient Rehabilitation [facility] [address] [place], [place], [address] Phone: [phone]   Fax:  [phone]  Name: Megan Bryan MRN: [id number] Date of Birth: 05-23-1981

## 2021-10-02 ENCOUNTER — Ambulatory Visit: Payer: 59 | Admitting: Physical Therapy

## 2021-10-02 ENCOUNTER — Encounter: Payer: Self-pay | Admitting: Physical Therapy

## 2021-10-02 ENCOUNTER — Other Ambulatory Visit: Payer: Self-pay

## 2021-10-02 DIAGNOSIS — M25562 Pain in left knee: Secondary | ICD-10-CM

## 2021-10-02 DIAGNOSIS — M25662 Stiffness of left knee, not elsewhere classified: Secondary | ICD-10-CM

## 2021-10-02 DIAGNOSIS — R262 Difficulty in walking, not elsewhere classified: Secondary | ICD-10-CM

## 2021-10-02 DIAGNOSIS — M6281 Muscle weakness (generalized): Secondary | ICD-10-CM

## 2021-10-02 NOTE — Therapy (Signed)
[place], [place], [address] Phone: [phone]   Fax:  [phone]  Physical Therapy Treatment  Patient Details  Name: Megan Bryan MRN: [id number] Date of Birth: 1981/10/16 Referring Provider (PT): Hiram Gash, MD   Encounter Date: 10/02/2021   PT End of Session - 10/02/21 0808     Visit Number 22    Number of Visits 31    Date for PT Re-Evaluation 10/23/21    Authorization Type [employer]    PT Start Time 0803    PT Stop Time 0845    PT Time Calculation (min) 42 min             Past Medical History:  Diagnosis Date   BMI 27.0-27.9,adult     Past Surgical History:  Procedure Laterality Date   ANTERIOR CRUCIATE LIGAMENT REPAIR Left 06/05/2021   Procedure: ANTERIOR CRUCIATE LIGAMENT (ACL) REPAIR with patellar tendon graft;  Surgeon: Hiram Gash, MD;  Location: [place];  Service: Orthopedics;  Laterality: Left;   KNEE ARTHROSCOPY WITH LATERAL MENISECTOMY Left 06/05/2021   Procedure: KNEE ARTHROSCOPY WITH LATERAL MENISECTOMY;  Surgeon: Hiram Gash, MD;  Location: [place];  Service: Orthopedics;  Laterality: Left;   KNEE ARTHROSCOPY WITH MEDIAL MENISECTOMY Left 06/05/2021   Procedure: KNEE ARTHROSCOPY WITH MEDIAL MENISECTOMY;  Surgeon: Hiram Gash, MD;  Location: [place];  Service: Orthopedics;  Laterality: Left;   TUBAL LIGATION      There were no vitals filed for this visit.   [facility] Adult PT Treatment/Exercise:  Therapeutic Exercise: Elliptical L5 , ramp L5 x 5 min  Quad , gastroc and hamstring stretch standing 2 x 30 sec  Single heel raise  Lateral shuffles 25ft x3 with 2 minute rest x 3  Ladder drills for foot work-2 feet in/out, 1 foot in/out/ zig zags    Manual Therapy:  N/A  Neuromuscular re-ed: N/A  Therapeutic Activity: Light jogging 4 x 60 ft 20-50% max speed  Jogging in place 20 sec x  2   Modalities: N/A  Self Care: N/A  Consider / progression for next session:       PT Short Term Goals - 08/28/21 0801       PT SHORT TERM GOAL #1   Title The patient will be independent in an initial ACLR home exercise program.    Period Weeks    Status Achieved      PT SHORT TERM GOAL #2   Title The patient will present with improvement of left knee flexion active motion to 90 degrees for transfers and ambulation.    Status Achieved      PT SHORT TERM GOAL #3   Title The patient will be able to perform 30 reps of left SLR without an extensor lag for transition to waking without an AD.    Status Achieved               PT Long Term Goals - 10/02/21 1159       PT LONG TERM GOAL #1   Title The patient will have an improved FOTO score to 69% or better for return to premorbid activities.    Baseline 34%    Time 12    Status On-going    Target Date 08/30/21      PT LONG TERM GOAL #2   Title The patient will be able to perform an eight inch step down with no  compensation and good left knee eccentric control for stairs    Baseline step to pattern w/ crutches; 08/14/21- improving control with step down, 08/26/21: can perform 6 inch step down    Time 12    Period Weeks    Status On-going    Target Date 08/30/21      PT LONG TERM GOAL #3   Title The patient will be able to perform a single leg stance on foam x 30 second or greater for return to community work with childern.    Baseline >30 sec on foam    Time 12    Period Weeks    Status Achieved    Target Date 08/30/21      PT LONG TERM GOAL #4   Title The patient will be able to squat to full depth with equal weight bearing for lifting.    Baseline improving ROM left knee    Time 12    Period Weeks    Status Partially Met    Target Date 08/30/21      PT LONG TERM GOAL #5   Title The patient will be able to complete a functonal hop test or Y balance testing and score a 90% or better for return to  recreational and community activities.    Time 18    Period Weeks    Status Unable to assess    Target Date 10/11/21                   Plan - 10/02/21 0907     Clinical Impression Statement Pt reports she has been jogging 30 seconds at a time for 5-10 intervals. She reports continued hesitancy and fear of injury with jogging. Began ladder drills per return to jogging protocol. She did well with foot work and stepping strategies with min cues for technique. She will work on this and continue to increase her intensity as tolerated.    PT Treatment/Interventions ADLs/Self Care Home Management;Aquatic Therapy;Cryotherapy;Electrical Stimulation;Moist Heat;Gait training;Stair training;Functional mobility training;Therapeutic activities;Therapeutic exercise;Balance training;Neuromuscular re-education;Manual techniques;Patient/family education;Passive range of motion;Dry needling;Joint Manipulations;Spinal Manipulations;Taping    PT Next Visit Plan return to jogging progression    PT Home Exercise Plan Access Code: [email or web address] (updated new code due to old code not working)             Patient will benefit from skilled therapeutic intervention in order to improve the following deficits and impairments:  Abnormal gait, Decreased range of motion, Difficulty walking, Pain, Decreased balance, Impaired flexibility, Improper body mechanics, Decreased mobility, Decreased strength, Increased edema  Visit Diagnosis: Decreased range of motion of left knee  Acute pain of left knee  Muscle weakness (generalized)  Difficulty in walking, not elsewhere classified     Problem List Patient Active Problem List   Diagnosis Date Noted   BMI 27.0-27.9,adult     Dorene Ar, PTA 10/02/2021, 12:00 PM  [facility] [address] [place], [place], [address] Phone: [phone]   Fax:  [phone]  Name: Megan Bryan MRN:  [id number] Date of Birth: [birth year]

## 2021-10-09 ENCOUNTER — Encounter: Payer: Self-pay | Admitting: Physical Therapy

## 2021-10-09 ENCOUNTER — Ambulatory Visit: Payer: 59 | Admitting: Physical Therapy

## 2021-10-09 ENCOUNTER — Other Ambulatory Visit: Payer: Self-pay

## 2021-10-09 DIAGNOSIS — M25662 Stiffness of left knee, not elsewhere classified: Secondary | ICD-10-CM | POA: Diagnosis not present

## 2021-10-09 DIAGNOSIS — M25562 Pain in left knee: Secondary | ICD-10-CM

## 2021-10-09 DIAGNOSIS — R262 Difficulty in walking, not elsewhere classified: Secondary | ICD-10-CM

## 2021-10-09 NOTE — Therapy (Signed)
[place], [place], [address] Phone: [phone]   Fax:  [phone]  Physical Therapy Treatment  Patient Details  Name: Megan Bryan MRN: [id number] Date of Birth: May 31, 1981 Referring Provider (PT): Hiram Gash, MD   Encounter Date: 10/09/2021   PT End of Session - 10/09/21 0801     Visit Number 23    Number of Visits 31    Date for PT Re-Evaluation 10/23/21    Authorization Type [employer]    PT Start Time 0801    PT Stop Time 0845    PT Time Calculation (min) 44 min    Activity Tolerance Patient tolerated treatment well    Behavior During Therapy [facility] for tasks assessed/performed             Past Medical History:  Diagnosis Date   BMI 27.0-27.9,adult     Past Surgical History:  Procedure Laterality Date   ANTERIOR CRUCIATE LIGAMENT REPAIR Left 06/05/2021   Procedure: ANTERIOR CRUCIATE LIGAMENT (ACL) REPAIR with patellar tendon graft;  Surgeon: Hiram Gash, MD;  Location: [place];  Service: Orthopedics;  Laterality: Left;   KNEE ARTHROSCOPY WITH LATERAL MENISECTOMY Left 06/05/2021   Procedure: KNEE ARTHROSCOPY WITH LATERAL MENISECTOMY;  Surgeon: Hiram Gash, MD;  Location: [place];  Service: Orthopedics;  Laterality: Left;   KNEE ARTHROSCOPY WITH MEDIAL MENISECTOMY Left 06/05/2021   Procedure: KNEE ARTHROSCOPY WITH MEDIAL MENISECTOMY;  Surgeon: Hiram Gash, MD;  Location: [place];  Service: Orthopedics;  Laterality: Left;   TUBAL LIGATION      There were no vitals filed for this visit.   Subjective Assessment - 10/09/21 0801     Subjective "I've been trying to do running with my childs track coach and I noticed I am pretty sore. they stated I probably need to work my hips    Limitations Standing;Walking;House hold activities    Patient Stated Goals To get back to being active, running, and playing basketball.                       [facility] Adult PT Treatment:     DATE: 10/09/2021 Therapeutic Exercise: Stepper L2 x 5 min  Hamstring / quad stretch 2 x 30 second  ITB/glute med stretch Standing marching with RTB around foot 2 x 20 bil Cues for contralaterla reciprocal UE movement Step up on to 8 inch step x12, 10 inch step x 12, 12 inch step x 12.  Hip abduction machine 1 x going to fatigue bil 25#  Manual Therapy:  Foam rolling along the glute med/min, vastus lasteralis/ and rectus femoris.                     PT Education - 10/09/21 0810     Education Details reviewed interval training for jogging with progression.    Person(s) Educated Patient    Methods Explanation;Verbal cues;Handout    Comprehension Verbalized understanding;Verbal cues required              PT Short Term Goals - 08/28/21 0801       PT SHORT TERM GOAL #1   Title The patient will be independent in an initial ACLR home exercise program.    Period Weeks    Status Achieved      PT SHORT TERM GOAL #2   Title The patient will present with improvement of left knee flexion active  motion to 90 degrees for transfers and ambulation.    Status Achieved      PT SHORT TERM GOAL #3   Title The patient will be able to perform 30 reps of left SLR without an extensor lag for transition to waking without an AD.    Status Achieved               PT Long Term Goals - 10/02/21 1159       PT LONG TERM GOAL #1   Title The patient will have an improved FOTO score to 69% or better for return to premorbid activities.    Baseline 34%    Time 12    Status On-going    Target Date 08/30/21      PT LONG TERM GOAL #2   Title The patient will be able to perform an eight inch step down with no compensation and good left knee eccentric control for stairs    Baseline step to pattern w/ crutches; 08/14/21- improving control with step down, 08/26/21: can perform 6 inch step down    Time 12    Period Weeks     Status On-going    Target Date 08/30/21      PT LONG TERM GOAL #3   Title The patient will be able to perform a single leg stance on foam x 30 second or greater for return to community work with childern.    Baseline >30 sec on foam    Time 12    Period Weeks    Status Achieved    Target Date 08/30/21      PT LONG TERM GOAL #4   Title The patient will be able to squat to full depth with equal weight bearing for lifting.    Baseline improving ROM left knee    Time 12    Period Weeks    Status Partially Met    Target Date 08/30/21      PT LONG TERM GOAL #5   Title The patient will be able to complete a functonal hop test or Y balance testing and score a 90% or better for return to recreational and community activities.    Time 18    Period Weeks    Status Unable to assess    Target Date 10/11/21                   Plan - 10/09/21 0849     Clinical Impression Statement pt is 18 weeks post op today. She reports doing exercises with her kids track coach and maintaining interval training but notes she was more sore, which is likely due to running with others and natural competitive nature. continued working on gross hip/ knee stretching following STW to address quad and hamstring stiffness. Overall Megan Bryan continues to do very well with PT and is progressing appropriately.    Examination-Activity Limitations Bathing;Locomotion Level;Transfers;Bed Mobility;Caring for Others;Carry;Sleep;Dressing;Lift;Stand;Stairs;Squat    Examination-Participation Restrictions Cleaning;Church;Meal Prep;Occupation;Driving;Yard Work;Volunteer;Laundry;Shop;Community Activity    PT Treatment/Interventions ADLs/Self Care Home Management;Aquatic Therapy;Cryotherapy;Electrical Stimulation;Moist Heat;Gait training;Stair training;Functional mobility training;Therapeutic activities;Therapeutic exercise;Balance training;Neuromuscular re-education;Manual techniques;Patient/family education;Passive range of  motion;Dry needling;Joint Manipulations;Spinal Manipulations;Taping    PT Next Visit Plan return to jogging progression    PT Home Exercise Plan Access Code: [email or web address] (updated new code due to old code not working)    Oncologist with Plan of Care Patient             Patient will benefit from skilled therapeutic intervention  in order to improve the following deficits and impairments:  Abnormal gait, Decreased range of motion, Difficulty walking, Pain, Decreased balance, Impaired flexibility, Improper body mechanics, Decreased mobility, Decreased strength, Increased edema  Visit Diagnosis: Decreased range of motion of left knee  Acute pain of left knee  Difficulty in walking, not elsewhere classified     Problem List Patient Active Problem List   Diagnosis Date Noted   BMI 27.0-27.9,adult    Starr Lake PT, DPT, LAT, ATC  10/09/21  8:58 AM      [facility] [address] [place], [place], [address] Phone: [phone]   Fax:  [phone]  Name: Megan Bryan MRN: [id number] Date of Birth: 09-11-1981

## 2021-10-09 NOTE — Patient Instructions (Signed)
Interval training Max speed between 25-50% Walking 2 min - Run 1 min x 2 weeks (perform 6 x)  3 x a week for 2 weeks Walking 1:30 min - Run 1:30 min x 2 weeks (perform 6 x)  3 x a week  Walking 1:00 min - Run 2:00 min x 2 weeks (perform 6 x)  3 x a week  Progress max speed to between 50-75% Walking 2 min - Run 1 min x 2 weeks (perform 6 x)  3 x a week  Walking 1:30 min - Run 1:30 min x 2 weeks (perform 6 x)  3 x a week  Walking 1:00 min - Run 2:00 min x 2 weeks (perform 6 x)  3 x a week

## 2021-10-16 ENCOUNTER — Other Ambulatory Visit: Payer: Self-pay

## 2021-10-16 ENCOUNTER — Ambulatory Visit: Payer: 59 | Admitting: Physical Therapy

## 2021-10-16 ENCOUNTER — Encounter: Payer: Self-pay | Admitting: Physical Therapy

## 2021-10-16 DIAGNOSIS — R262 Difficulty in walking, not elsewhere classified: Secondary | ICD-10-CM

## 2021-10-16 DIAGNOSIS — M25662 Stiffness of left knee, not elsewhere classified: Secondary | ICD-10-CM | POA: Diagnosis not present

## 2021-10-16 DIAGNOSIS — M6281 Muscle weakness (generalized): Secondary | ICD-10-CM

## 2021-10-16 DIAGNOSIS — M25562 Pain in left knee: Secondary | ICD-10-CM

## 2021-10-16 NOTE — Therapy (Signed)
[place], [place], [address] Phone: [phone]   Fax:  [phone]  Physical Therapy Treatment  Patient Details  Name: Megan Bryan MRN: [id number] Date of Birth: Dec 31, 1980 Referring Provider (PT): Hiram Gash, MD   Encounter Date: 10/16/2021   PT End of Session - 10/16/21 0808     Visit Number 24    Number of Visits 31    Date for PT Re-Evaluation 10/23/21    Authorization Type [employer]    PT Start Time 0802    PT Stop Time 0855    PT Time Calculation (min) 53 min             Past Medical History:  Diagnosis Date   BMI 27.0-27.9,adult     Past Surgical History:  Procedure Laterality Date   ANTERIOR CRUCIATE LIGAMENT REPAIR Left 06/05/2021   Procedure: ANTERIOR CRUCIATE LIGAMENT (ACL) REPAIR with patellar tendon graft;  Surgeon: Hiram Gash, MD;  Location: [facility];  Service: Orthopedics;  Laterality: Left;   KNEE ARTHROSCOPY WITH LATERAL MENISECTOMY Left 06/05/2021   Procedure: KNEE ARTHROSCOPY WITH LATERAL MENISECTOMY;  Surgeon: Hiram Gash, MD;  Location: [facility];  Service: Orthopedics;  Laterality: Left;   KNEE ARTHROSCOPY WITH MEDIAL MENISECTOMY Left 06/05/2021   Procedure: KNEE ARTHROSCOPY WITH MEDIAL MENISECTOMY;  Surgeon: Hiram Gash, MD;  Location: [facility];  Service: Orthopedics;  Laterality: Left;   TUBAL LIGATION      There were no vitals filed for this visit.   Subjective Assessment - 10/16/21 0807     Subjective Feel a little tight this morning. Jogged yesterday. Did not jog over the cold weekend.    Currently in Pain? No/denies             [facility] Adult PT Treatment:     DATE: 10/09/2021 Therapeutic Exercise: Stepper L2 x 5 min  Hamstring / quad stretch 2 x 30 second , slant board stretch Pilates Reformer with jump board (2 red springs) - bilateral and single leg foot work in neutral and on toes,  bilateral hopping , single leg hopping  Left single leg on reformer bar (foot work on toe) bilat x 15 , bilat foot work on heel neutral   Bilat hopping in place 10 x 3  Soleus stretch on slant board  Single leg heel raise x 25 each  Slant board gastroc stretch Self foam roller to lower lateral leg   Modalities: ice pack post session to left knee  Manual Therapy:  N/A            PT Short Term Goals - 08/28/21 0801       PT SHORT TERM GOAL #1   Title The patient will be independent in an initial ACLR home exercise program.    Period Weeks    Status Achieved      PT SHORT TERM GOAL #2   Title The patient will present with improvement of left knee flexion active motion to 90 degrees for transfers and ambulation.    Status Achieved      PT SHORT TERM GOAL #3   Title The patient will be able to perform 30 reps of left SLR without an extensor lag for transition to waking without an AD.    Status Achieved               PT Long Term Goals - 10/02/21 1159  PT LONG TERM GOAL #1   Title The patient will have an improved FOTO score to 69% or better for return to premorbid activities.    Baseline 34%    Time 12    Status On-going    Target Date 08/30/21      PT LONG TERM GOAL #2   Title The patient will be able to perform an eight inch step down with no compensation and good left knee eccentric control for stairs    Baseline step to pattern w/ crutches; 08/14/21- improving control with step down, 08/26/21: can perform 6 inch step down    Time 12    Period Weeks    Status On-going    Target Date 08/30/21      PT LONG TERM GOAL #3   Title The patient will be able to perform a single leg stance on foam x 30 second or greater for return to community work with childern.    Baseline >30 sec on foam    Time 12    Period Weeks    Status Achieved    Target Date 08/30/21      PT LONG TERM GOAL #4   Title The patient will be able to squat to full depth with equal  weight bearing for lifting.    Baseline improving ROM left knee    Time 12    Period Weeks    Status Partially Met    Target Date 08/30/21      PT LONG TERM GOAL #5   Title The patient will be able to complete a functonal hop test or Y balance testing and score a 90% or better for return to recreational and community activities.    Time 18    Period Weeks    Status Unable to assess    Target Date 10/11/21                   Plan - 10/16/21 0851     Clinical Impression Statement Pt is 19 weeks post op and is jogging on her own. She reports tightness and soreness are most limiting at lateral knee. Introduced pilates reformed to begin jumping in gravity diminshed position which she tolerated well but with continued hesitancy and feeling of lateral knee tightness. She has been working on lateral quad and ITB rolling and also has tenderness in proximal peroneal. She was instructed in self muscle rolling to lateral lower leg and reviewed single leg heel raises and calf stretches. She can complete 25 heel raises on each leg with increased effort required on left. Worked on bilaterl hopping in place which she required cues to land with equal weight.    PT Treatment/Interventions ADLs/Self Care Home Management;Aquatic Therapy;Cryotherapy;Electrical Stimulation;Moist Heat;Gait training;Stair training;Functional mobility training;Therapeutic activities;Therapeutic exercise;Balance training;Neuromuscular re-education;Manual techniques;Patient/family education;Passive range of motion;Dry needling;Joint Manipulations;Spinal Manipulations;Taping    PT Next Visit Plan return to jogging progression; beginner plyo; continue jumping on reformer 2 red springs    PT Home Exercise Plan Access Code: [email or web address] (updated new code due to old code not working)             Patient will benefit from skilled therapeutic intervention in order to improve the following deficits and impairments:  Abnormal gait,  Decreased range of motion, Difficulty walking, Pain, Decreased balance, Impaired flexibility, Improper body mechanics, Decreased mobility, Decreased strength, Increased edema  Visit Diagnosis: Decreased range of motion of left knee  Acute pain of left knee  Difficulty in walking, not elsewhere  classified  Muscle weakness (generalized)     Problem List Patient Active Problem List   Diagnosis Date Noted   BMI 27.0-27.9,adult     Dorene Ar, [place] 10/16/2021, 9:52 AM  [facility] [address] [place], [place], [address] Phone: [phone]   Fax:  [phone]  Name: FAYOLA MECKES MRN: [id number] Date of Birth: 09-27-1981

## 2021-10-23 ENCOUNTER — Encounter: Payer: Self-pay | Admitting: Physical Therapy

## 2021-10-23 ENCOUNTER — Ambulatory Visit: Payer: 59 | Attending: Orthopaedic Surgery | Admitting: Physical Therapy

## 2021-10-23 ENCOUNTER — Other Ambulatory Visit: Payer: Self-pay

## 2021-10-23 DIAGNOSIS — M25662 Stiffness of left knee, not elsewhere classified: Secondary | ICD-10-CM

## 2021-10-23 DIAGNOSIS — M6281 Muscle weakness (generalized): Secondary | ICD-10-CM | POA: Diagnosis not present

## 2021-10-23 DIAGNOSIS — R262 Difficulty in walking, not elsewhere classified: Secondary | ICD-10-CM | POA: Diagnosis not present

## 2021-10-23 DIAGNOSIS — M25562 Pain in left knee: Secondary | ICD-10-CM

## 2021-10-23 NOTE — Therapy (Signed)
[place], [place], [address] Phone: [phone]   Fax:  [phone]  Physical Therapy Treatment  Patient Details  Name: Megan Bryan MRN: [id number] Date of Birth: 23-Feb-1981 Referring Provider (PT): Hiram Gash, MD   Encounter Date: 10/23/2021   PT End of Session - 10/23/21 0810     Visit Number 25    Number of Visits 31    Date for PT Re-Evaluation 12/18/21    Authorization Type [employer]    PT Start Time 0805    PT Stop Time 0848    PT Time Calculation (min) 43 min    Activity Tolerance Patient tolerated treatment well    Behavior During Therapy [facility] for tasks assessed/performed             Past Medical History:  Diagnosis Date   BMI 27.0-27.9,adult     Past Surgical History:  Procedure Laterality Date   ANTERIOR CRUCIATE LIGAMENT REPAIR Left 06/05/2021   Procedure: ANTERIOR CRUCIATE LIGAMENT (ACL) REPAIR with patellar tendon graft;  Surgeon: Hiram Gash, MD;  Location: [facility];  Service: Orthopedics;  Laterality: Left;   KNEE ARTHROSCOPY WITH LATERAL MENISECTOMY Left 06/05/2021   Procedure: KNEE ARTHROSCOPY WITH LATERAL MENISECTOMY;  Surgeon: Hiram Gash, MD;  Location: [facility];  Service: Orthopedics;  Laterality: Left;   KNEE ARTHROSCOPY WITH MEDIAL MENISECTOMY Left 06/05/2021   Procedure: KNEE ARTHROSCOPY WITH MEDIAL MENISECTOMY;  Surgeon: Hiram Gash, MD;  Location: [facility];  Service: Orthopedics;  Laterality: Left;   TUBAL LIGATION      There were no vitals filed for this visit.   Subjective Assessment - 10/23/21 0812     Subjective "I am doing pretty good, some stiffness but otherwise I am doing pretty good."    Patient Stated Goals To get back to being active, running, and playing basketball.    Currently in Pain? No/denies    Pain Onset More than a month ago                [facility] PT Assessment -  10/23/21 0001       Assessment   Medical Diagnosis s/p L Knee scope lateral meniscectomy, ACL repair, Laeral meniscus repair SX 06/05/21    Referring Provider (PT) Hiram Gash, MD    Onset Date/Surgical Date 06/05/21                       [facility] Adult PT Treatment:                                                DATE: 10/23/2021  Therapeutic Exercise: Standing L quad stretching 2 x 30 sec With foot in chair with 1 HHA for stability Standing L hamstring stretch 2 x 30 sec Foot in chair L hip ITB stretch 2 x 30 sec  Standing putting hip against the wall Hamstring curl 1 x LLE going to fatigue 35#, progressing to double limb contract/ LLE eccentric to fatigue Omega LLE single leg press, 1 x to fatigue 45#, 1 x to fatigue 35#, 1 x to fatigue 25# (pyramid tech Neuromuscular re-ed: Standing in front of the mirror doing small jumps 3 x 10 Using mirror for visual feedback.and moderate verbal cues and demonstration for proper form High knee  marching 4 x 50 ft controlled pace (avoiding jumping) Progressed to doing faster pace                  PT Short Term Goals - 08/28/21 0801       PT SHORT TERM GOAL #1   Title The patient will be independent in an initial ACLR home exercise program.    Period Weeks    Status Achieved      PT SHORT TERM GOAL #2   Title The patient will present with improvement of left knee flexion active motion to 90 degrees for transfers and ambulation.    Status Achieved      PT SHORT TERM GOAL #3   Title The patient will be able to perform 30 reps of left SLR without an extensor lag for transition to waking without an AD.    Status Achieved               PT Long Term Goals - 10/23/21 0855       PT LONG TERM GOAL #1   Title The patient will have an improved FOTO score to 69% or better for return to premorbid activities.    Period Weeks    Status On-going    Target Date 12/04/21      PT LONG TERM GOAL #2   Title The  patient will be able to perform an eight inch step down with no compensation and good left knee eccentric control for stairs    Period Weeks    Status On-going    Target Date 12/04/21      PT LONG TERM GOAL #3   Title The patient will be able to perform a single leg stance on foam x 30 second or greater for return to community work with childern.    Period Weeks    Status Achieved      PT LONG TERM GOAL #4   Title The patient will be able to squat to full depth with equal weight bearing for lifting.    Period Weeks    Status Partially Met      PT LONG TERM GOAL #5   Title The patient will be able to complete a functonal hop test or Y balance testing and score a 90% or better for return to recreational and community activities.    Period Weeks    Status On-going    Target Date 12/04/21                   Plan - 10/23/21 0855     Clinical Impression Statement Megan Bryan is 20 weeks post op and contiues to make excellent progress with physical therapy. She continues to demonstrate apprehension with higher level activities such as light double limb in place jumping but is able to complete exercise with cues. 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 Plan to drop down to biweekly visits for the next 6 visits to continue progressing exercise. plan to continue progressing strength, plyometric activity, balance and return to PLOF by addressing the deficits.    Examination-Activity Limitations Bathing;Locomotion Level;Transfers;Bed Mobility;Caring for Others;Carry;Sleep;Dressing;Lift;Stand;Stairs;Squat    Examination-Participation Restrictions Cleaning;Church;Meal Prep;Occupation;Driving;Yard Work;Volunteer;Laundry;Shop;Community Activity    PT Frequency Biweekly    PT Duration 6 weeks    PT Treatment/Interventions ADLs/Self Care Home Management;Aquatic Therapy;Cryotherapy;Electrical Stimulation;Moist Heat;Gait training;Stair training;Functional mobility  training;Therapeutic activities;Therapeutic exercise;Balance training;Neuromuscular re-education;Manual techniques;Patient/family education;Passive range of motion;Dry needling;Joint Manipulations;Spinal Manipulations;Taping    PT Next Visit Plan return to jogging progression; beginner  plyo; continue jumping on reformer 2 red springs, pt is 5 months post op on 10/23/2021    PT Home Exercise Plan Access Code: [email or web address] (updated new code due to old code not working)    Oncologist with Plan of Care Patient             Patient will benefit from skilled therapeutic intervention in order to improve the following deficits and impairments:  Abnormal gait, Decreased range of motion, Difficulty walking, Pain, Decreased balance, Impaired flexibility, Improper body mechanics, Decreased mobility, Decreased strength, Increased edema  Visit Diagnosis: Decreased range of motion of left knee  Acute pain of left knee  Difficulty in walking, not elsewhere classified  Muscle weakness (generalized)     Problem List Patient Active Problem List   Diagnosis Date Noted   BMI 27.0-27.9,adult     Megan Bryan PT, DPT, LAT, ATC  10/23/21  9:03 AM      [facility] [address] [place], [place], [address] Phone: [phone]   Fax:  [phone]  Name: Megan Bryan MRN: [id number] Date of Birth: 1981-05-03

## 2021-11-06 ENCOUNTER — Other Ambulatory Visit: Payer: Self-pay

## 2021-11-06 ENCOUNTER — Ambulatory Visit: Payer: 59 | Admitting: Physical Therapy

## 2021-11-06 ENCOUNTER — Encounter: Payer: Self-pay | Admitting: Physical Therapy

## 2021-11-06 DIAGNOSIS — M6281 Muscle weakness (generalized): Secondary | ICD-10-CM | POA: Diagnosis not present

## 2021-11-06 DIAGNOSIS — M25562 Pain in left knee: Secondary | ICD-10-CM

## 2021-11-06 DIAGNOSIS — R262 Difficulty in walking, not elsewhere classified: Secondary | ICD-10-CM | POA: Diagnosis not present

## 2021-11-06 DIAGNOSIS — M25662 Stiffness of left knee, not elsewhere classified: Secondary | ICD-10-CM

## 2021-11-06 NOTE — Therapy (Signed)
[place], [place], [address] Phone: [phone]   Fax:  [phone]  Physical Therapy Treatment  Patient Details  Name: Megan Bryan MRN: [id number] Date of Birth: [birth year] Referring Provider (PT): Hiram Gash, MD   Encounter Date: 11/06/2021   PT End of Session - 11/06/21 0936     Visit Number 26    Number of Visits 31    Date for PT Re-Evaluation 12/18/21    Authorization Type [employer]    PT Start Time [phone]   pt arrived late   PT Stop Time 1016    PT Time Calculation (min) 40 min    Activity Tolerance Patient tolerated treatment well    Behavior During Therapy [facility] for tasks assessed/performed             Past Medical History:  Diagnosis Date   BMI 27.0-27.9,adult     Past Surgical History:  Procedure Laterality Date   ANTERIOR CRUCIATE LIGAMENT REPAIR Left 06/05/2021   Procedure: ANTERIOR CRUCIATE LIGAMENT (ACL) REPAIR with patellar tendon graft;  Surgeon: Hiram Gash, MD;  Location: [facility];  Service: Orthopedics;  Laterality: Left;   KNEE ARTHROSCOPY WITH LATERAL MENISECTOMY Left 06/05/2021   Procedure: KNEE ARTHROSCOPY WITH LATERAL MENISECTOMY;  Surgeon: Hiram Gash, MD;  Location: [facility];  Service: Orthopedics;  Laterality: Left;   KNEE ARTHROSCOPY WITH MEDIAL MENISECTOMY Left 06/05/2021   Procedure: KNEE ARTHROSCOPY WITH MEDIAL MENISECTOMY;  Surgeon: Hiram Gash, MD;  Location: [facility];  Service: Orthopedics;  Laterality: Left;   TUBAL LIGATION      There were no vitals filed for this visit.   Subjective Assessment - 11/06/21 0938     Subjective " I have no pain, I do feel like my jumping is challenged."    Patient Stated Goals To get back to being active, running, and playing basketball.    Currently in Pain? No/denies                                   [facility] Adult PT Treatment:                                                 DATE: 11/06/2021  Therapeutic Exercise: Stepper L3 x 3 min 1 x L knee quad stretch 1 x hamstring stretch holding 30 sec ea.  Manual Therapy: MTPR along the vastus lateralis  Therapeutic Activity: Reformer double limb jumping 2 x 15  With 2 red and 1 yellow Reformer single limb jumping 2 x 10 Cues to increase knee flexion  Double limb jumping laterally 4 x 10 ft bil Favors LLE and exhibits weight shift to the R High knees 4 x 12 alternating L/R Tactile cues for proper form         PT Short Term Goals - 08/28/21 0801       PT SHORT TERM GOAL #1   Title The patient will be independent in an initial ACLR home exercise program.    Period Weeks    Status Achieved      PT SHORT TERM GOAL #2   Title The patient will present with improvement of left knee flexion active motion to 90 degrees for transfers and  ambulation.   ° Status Achieved   °  ° PT SHORT TERM GOAL #3  ° Title The patient will be able to perform 30 reps of left SLR without an extensor lag for transition to waking without an AD.   ° Status Achieved   ° °  °  ° °  ° ° ° ° PT Long Term Goals - 10/23/21 0855   ° °  ° PT LONG TERM GOAL #1  ° Title The patient will have an improved FOTO score to 69% or better for return to premorbid activities.   ° Period Weeks   ° Status On-going   ° Target Date 12/04/21   °  ° PT LONG TERM GOAL #2  ° Title The patient will be able to perform an eight inch step down with no compensation and good left knee eccentric control for stairs   ° Period Weeks   ° Status On-going   ° Target Date 12/04/21   °  ° PT LONG TERM GOAL #3  ° Title The patient will be able to perform a single leg stance on foam x 30 second or greater for return to community work with childern.   ° Period Weeks   ° Status Achieved   °  ° PT LONG TERM GOAL #4  ° Title The patient will be able to squat to full depth with equal weight bearing for lifting.   ° Period Weeks   ° Status Partially  Met   °  ° PT LONG TERM GOAL #5  ° Title The patient will be able to complete a functonal hop test or Y balance testing and score a 90% or better for return to recreational and community activities.   ° Period Weeks   ° Status On-going   ° Target Date 12/04/21   ° °  °  ° °  ° ° ° ° ° ° ° ° Plan - 11/06/21 1046   ° ° Clinical Impression Statement Kanani arrives to to treatment 22 weeks post op and continues to report no pain but does describe some soreness located along the distal lateral thigh. educated and consent was given for TPDN focusing on the vastus lateralis which she noted reduction of tension with jumping. utilized the reformer to practiced double and single limb jumping. pt does continue to present with compensation likely due to apprehension. continued working on higher level jumping forward single leg, and high knee, and double limb lateraljumping whcih she demonstrated increased R weight shifting. no report of pain following session.   ° PT Treatment/Interventions ADLs/Self Care Home Management;Aquatic Therapy;Cryotherapy;Electrical Stimulation;Moist Heat;Gait training;Stair training;Functional mobility training;Therapeutic activities;Therapeutic exercise;Balance training;Neuromuscular re-education;Manual techniques;Patient/family education;Passive range of motion;Dry needling;Joint Manipulations;Spinal Manipulations;Taping   ° PT Next Visit Plan lateral jumping double limb, single limb, quick feet, gross strengthening,   beginner plyo; continue jumping on reformer 2 red springs, pt is 22 weeks post op on 11/06/2021   ° PT Home Exercise Plan Access Code: [email or web address]   ° Consulted and Agree with Plan of Care Patient   ° °  °  ° °  ° ° °Patient will benefit from skilled therapeutic intervention in order to improve the following deficits and impairments:  Abnormal gait, Decreased range of motion, Difficulty walking, Pain, Decreased balance, Impaired flexibility, Improper body mechanics, Decreased mobility,  Decreased strength, Increased edema ° °Visit Diagnosis: °Decreased range of motion of left knee ° °Acute pain of left knee ° °Difficulty in walking, not elsewhere classified ° °Muscle   weakness (generalized) ° ° ° ° °Problem List °Patient Active Problem List  ° Diagnosis Date Noted  ° BMI 27.0-27.9,adult   ° ° °Kristoffer Leamon PT, DPT, LAT, ATC  °11/06/21  8:41 PM ° ° ° ° ° °[place] °[facility] °[address] °[place], [place], [address] °Phone: [phone]   Fax:  [phone] ° °Name: Skyler B Needles °MRN: [id number] °Date of Birth: 12/05/1980 ° ° ° °

## 2021-11-20 ENCOUNTER — Encounter: Payer: Self-pay | Admitting: Physical Therapy

## 2021-11-20 ENCOUNTER — Other Ambulatory Visit: Payer: Self-pay

## 2021-11-20 ENCOUNTER — Ambulatory Visit: Payer: 59 | Attending: Orthopaedic Surgery | Admitting: Physical Therapy

## 2021-11-20 DIAGNOSIS — M25562 Pain in left knee: Secondary | ICD-10-CM | POA: Insufficient documentation

## 2021-11-20 DIAGNOSIS — R262 Difficulty in walking, not elsewhere classified: Secondary | ICD-10-CM | POA: Diagnosis not present

## 2021-11-20 DIAGNOSIS — M25662 Stiffness of left knee, not elsewhere classified: Secondary | ICD-10-CM

## 2021-11-20 DIAGNOSIS — M6281 Muscle weakness (generalized): Secondary | ICD-10-CM | POA: Diagnosis not present

## 2021-11-20 NOTE — Therapy (Signed)
[facility] [address] [place], [place], [address] Phone: [phone]   Fax:  [phone]  Physical Therapy Treatment  Patient Details  Name: Megan Bryan MRN: [id number] Date of Birth: October 15, 1981 Referring Provider (PT): Bjorn Pippin, MD   Encounter Date: 11/20/2021   PT End of Session - 11/20/21 0806     Visit Number 27    Number of Visits 31    Date for PT Re-Evaluation 12/18/21    Authorization Type [employer]    PT Start Time 0803    PT Stop Time 0852    PT Time Calculation (min) 49 min             Past Medical History:  Diagnosis Date   BMI 27.0-27.9,adult     Past Surgical History:  Procedure Laterality Date   ANTERIOR CRUCIATE LIGAMENT REPAIR Left 06/05/2021   Procedure: ANTERIOR CRUCIATE LIGAMENT (ACL) REPAIR with patellar tendon graft;  Surgeon: Bjorn Pippin, MD;  Location: [facility];  Service: Orthopedics;  Laterality: Left;   KNEE ARTHROSCOPY WITH LATERAL MENISECTOMY Left 06/05/2021   Procedure: KNEE ARTHROSCOPY WITH LATERAL MENISECTOMY;  Surgeon: Bjorn Pippin, MD;  Location: [facility];  Service: Orthopedics;  Laterality: Left;   KNEE ARTHROSCOPY WITH MEDIAL MENISECTOMY Left 06/05/2021   Procedure: KNEE ARTHROSCOPY WITH MEDIAL MENISECTOMY;  Surgeon: Bjorn Pippin, MD;  Location: [facility];  Service: Orthopedics;  Laterality: Left;   TUBAL LIGATION      There were no vitals filed for this visit.   Subjective Assessment - 11/20/21 0805     Subjective "My energy level is low today. I still have the soreness lateral knee."    Currently in Pain? No/denies            Therapeutic Exercise:  Stepper L3 x 5 min 6 inch and 4inch step down/ retro step up Single leg cone tap Front squat - cues for equal weight 25# x 1- 30 # x 10 Horizontal leg press 3 plates with RLE eccentric lowering 1 plate single leg - needs seat raise to <90  degrees Passive quad /hip flexor stretching, after knee flexion mobs and patella inferior glides Seated single leg press 45#- seated pulled forward to maximize hip/knee flexion Single leg bridge off mat x 10, on mat with foot on 6 inch step x 10  Neuromuscular re-ed: N/A        PT Short Term Goals - 08/28/21 0801       PT SHORT TERM GOAL #1   Title The patient will be independent in an initial ACLR home exercise program.    Period Weeks    Status Achieved      PT SHORT TERM GOAL #2   Title The patient will present with improvement of left knee flexion active motion to 90 degrees for transfers and ambulation.    Status Achieved      PT SHORT TERM GOAL #3   Title The patient will be able to perform 30 reps of left SLR without an extensor lag for transition to waking without an AD.    Status Achieved               PT Long Term Goals - 11/20/21 0855       PT LONG TERM GOAL #1   Title The patient will have an improved FOTO score to 69% or better for return to premorbid activities.    Baseline 34%  Time 12    Status On-going    Target Date 12/04/21      PT LONG TERM GOAL #2   Title The patient will be able to perform an eight inch step down with no compensation and good left knee eccentric control for stairs    Baseline step to pattern w/ crutches; 08/14/21- improving control with step down, 08/26/21: can perform 6 inch step down; 11/20/21: unable    Time 12    Period Weeks    Status On-going    Target Date 12/04/21      PT LONG TERM GOAL #3   Title The patient will be able to perform a single leg stance on foam x 30 second or greater for return to community work with childern.    Baseline >30 sec on foam    Time 12    Period Weeks    Status Achieved      PT LONG TERM GOAL #4   Title The patient will be able to squat to full depth with equal weight bearing for lifting.    Baseline improving ROM left knee; weight shifts to the right    Time 12    Period Weeks     Status On-going      PT LONG TERM GOAL #5   Title The patient will be able to complete a functonal hop test or Y balance testing and score a 90% or better for return to recreational and community activities.    Baseline not tested    Time 18    Period Weeks    Status On-going    Target Date 12/04/21                   Plan - 11/20/21 0945     Clinical Impression Statement Megan Bryan reports fatigue on arrival and a continued feeling of tightness and sorenss along lateral knee. Worked toward eccentric step down goal with difficulty maintaining balance and control from 6 inch.  Focused eccentric LLE strength on leg press (horizontal and seated) working into deep squat. Min weight tolerated for single leg press at 90 degrees or more.  Reviewed single leg bridge off side of mat or with feet on step to increased knee flexion depth during strengthening. After stretching her ROM reached 124 which is still less compared to RLE.    PT Treatment/Interventions ADLs/Self Care Home Management;Aquatic Therapy;Cryotherapy;Electrical Stimulation;Moist Heat;Gait training;Stair training;Functional mobility training;Therapeutic activities;Therapeutic exercise;Balance training;Neuromuscular re-education;Manual techniques;Patient/family education;Passive range of motion;Dry needling;Joint Manipulations;Spinal Manipulations;Taping    PT Next Visit Plan lateral jumping double limb, single limb, quick feet, gross strengthening,   beginner plyo; continue jumping on reformer 2 red springs, pt is 22 weeks post op on 11/06/2021    PT Home Exercise Plan Access Code: [email or web address]             Patient will benefit from skilled therapeutic intervention in order to improve the following deficits and impairments:  Abnormal gait, Decreased range of motion, Difficulty walking, Pain, Decreased balance, Impaired flexibility, Improper body mechanics, Decreased mobility, Decreased strength, Increased edema  Visit  Diagnosis: Decreased range of motion of left knee  Difficulty in walking, not elsewhere classified  Muscle weakness (generalized)     Problem List Patient Active Problem List   Diagnosis Date Noted   BMI 27.0-27.9,adult     Sherrie Mustache, PTA 11/20/2021, 9:52 AM  [facility] [address] [place], [place], [address] Phone: [phone]   Fax:  [phone]  Name: Warner MccreedyVernell B Bryan MRN: [id number] Date of Birth: Aug 28, 1981

## 2021-12-04 ENCOUNTER — Ambulatory Visit: Payer: 59 | Admitting: Physical Therapy

## 2021-12-04 ENCOUNTER — Other Ambulatory Visit: Payer: Self-pay

## 2021-12-04 ENCOUNTER — Encounter: Payer: Self-pay | Admitting: Physical Therapy

## 2021-12-04 DIAGNOSIS — M25562 Pain in left knee: Secondary | ICD-10-CM

## 2021-12-04 DIAGNOSIS — M6281 Muscle weakness (generalized): Secondary | ICD-10-CM | POA: Diagnosis not present

## 2021-12-04 DIAGNOSIS — R262 Difficulty in walking, not elsewhere classified: Secondary | ICD-10-CM | POA: Diagnosis not present

## 2021-12-04 DIAGNOSIS — M25662 Stiffness of left knee, not elsewhere classified: Secondary | ICD-10-CM | POA: Diagnosis not present

## 2021-12-04 NOTE — Patient Instructions (Signed)
Bounding hopping alternating L/R 2 x 10   Knee extension machine 15-20 lbs start with single leg and when you can't get any more progress to double leg and controlling down with the Left leg. 2-3 sets  Four square hopping you can start using both legs doing only lateral / side to side movements, and can progress to figure 8 movements. Once you get more comfortable you can doing single leg again staring with side to side movements and eventually progress to figure 8 movements

## 2021-12-04 NOTE — Therapy (Signed)
[facility] [address] [place], [place], [address] Phone: [phone]   Fax:  [phone]  Physical Therapy Treatment  Patient Details  Name: Megan Bryan MRN: [id number] Date of Birth: 06-Jan-1981 Referring Provider (PT): Bjorn Pippin, MD   Encounter Date: 12/04/2021   PT End of Session - 12/04/21 0930     Visit Number 28    Number of Visits 31    Date for PT Re-Evaluation 12/18/21    Authorization Type [employer]    PT Start Time 0930    PT Stop Time 1016    PT Time Calculation (min) 46 min    Activity Tolerance Patient tolerated treatment well    Behavior During Therapy [facility] for tasks assessed/performed             Past Medical History:  Diagnosis Date   BMI 27.0-27.9,adult     Past Surgical History:  Procedure Laterality Date   ANTERIOR CRUCIATE LIGAMENT REPAIR Left 06/05/2021   Procedure: ANTERIOR CRUCIATE LIGAMENT (ACL) REPAIR with patellar tendon graft;  Surgeon: Bjorn Pippin, MD;  Location: [facility];  Service: Orthopedics;  Laterality: Left;   KNEE ARTHROSCOPY WITH LATERAL MENISECTOMY Left 06/05/2021   Procedure: KNEE ARTHROSCOPY WITH LATERAL MENISECTOMY;  Surgeon: Bjorn Pippin, MD;  Location: [facility];  Service: Orthopedics;  Laterality: Left;   KNEE ARTHROSCOPY WITH MEDIAL MENISECTOMY Left 06/05/2021   Procedure: KNEE ARTHROSCOPY WITH MEDIAL MENISECTOMY;  Surgeon: Bjorn Pippin, MD;  Location: [facility];  Service: Orthopedics;  Laterality: Left;   TUBAL LIGATION      There were no vitals filed for this visit.   Subjective Assessment - 12/04/21 0931     Subjective "The outside of the knee has improved, I have been in beast mode trying work and strengthing"    Patient Stated Goals To get back to being active, running, and playing basketball.    Currently in Pain? No/denies    Pain Score 0-No pain    Pain Location Knee    Pain Orientation  Left    Pain Descriptors / Indicators Aching    Pain Type Chronic pain    Pain Onset More than a month ago    Pain Frequency Intermittent                                [facility] Adult PT Treatment:                                                DATE: 12/04/2021  Therapeutic Exercise: LAQ  LLE single leg x 10 15#, then con bil/ Ecc LLE x 10 with 3 sets LLE hamstring curl 1 x 20 20#, 1 x 20 #25 Neuromuscular re-ed: Single leg LLE stance with alternating UE cone taps 2 x 20 Therapeutic Activity: Ladder drill  Bil bunny hops x 4 Attempted single leg in ladder but had to halt exercise to due to challenge of jumping Bounding alternating L/R 4 x 50 ft [place] 4 x 40 ft - 2 x going slow, 2 x 25% max speed.  4 square hopping Double limb hopping side to side  Progress to single leg 2 x 10 - required verbal cues to allow for knee flexion  PT Short Term Goals - 08/28/21 0801       PT SHORT TERM GOAL #1   Title The patient will be independent in an initial ACLR home exercise program.    Period Weeks    Status Achieved      PT SHORT TERM GOAL #2   Title The patient will present with improvement of left knee flexion active motion to 90 degrees for transfers and ambulation.    Status Achieved      PT SHORT TERM GOAL #3   Title The patient will be able to perform 30 reps of left SLR without an extensor lag for transition to waking without an AD.    Status Achieved               PT Long Term Goals - 11/20/21 0855       PT LONG TERM GOAL #1   Title The patient will have an improved FOTO score to 69% or better for return to premorbid activities.    Baseline 34%    Time 12    Status On-going    Target Date 12/04/21      PT LONG TERM GOAL #2   Title The patient will be able to perform an eight inch step down with no compensation and good left knee eccentric control for stairs    Baseline step to pattern w/ crutches; 08/14/21- improving  control with step down, 08/26/21: can perform 6 inch step down; 11/20/21: unable    Time 12    Period Weeks    Status On-going    Target Date 12/04/21      PT LONG TERM GOAL #3   Title The patient will be able to perform a single leg stance on foam x 30 second or greater for return to community work with childern.    Baseline >30 sec on foam    Time 12    Period Weeks    Status Achieved      PT LONG TERM GOAL #4   Title The patient will be able to squat to full depth with equal weight bearing for lifting.    Baseline improving ROM left knee; weight shifts to the right    Time 12    Period Weeks    Status On-going      PT LONG TERM GOAL #5   Title The patient will be able to complete a functonal hop test or Y balance testing and score a 90% or better for return to recreational and community activities.    Baseline not tested    Time 18    Period Weeks    Status On-going    Target Date 12/04/21                    Patient will benefit from skilled therapeutic intervention in order to improve the following deficits and impairments:     Visit Diagnosis: Decreased range of motion of left knee  Difficulty in walking, not elsewhere classified  Muscle weakness (generalized)  Acute pain of left knee     Problem List Patient Active Problem List   Diagnosis Date Noted   BMI 27.0-27.9,adult    Lulu Riding PT, DPT, LAT, ATC  12/04/21  11:16 AM      [facility] [address] [place], [place], [address] Phone: [phone]   Fax:  [phone]  Name: Megan Bryan MRN: [id number] Date of Birth: Oct 15, 1981

## 2021-12-18 ENCOUNTER — Encounter: Payer: Self-pay | Admitting: Physical Therapy

## 2021-12-18 ENCOUNTER — Ambulatory Visit: Payer: 59 | Attending: Orthopaedic Surgery | Admitting: Physical Therapy

## 2021-12-18 ENCOUNTER — Other Ambulatory Visit: Payer: Self-pay

## 2021-12-18 DIAGNOSIS — M6281 Muscle weakness (generalized): Secondary | ICD-10-CM | POA: Insufficient documentation

## 2021-12-18 DIAGNOSIS — M25562 Pain in left knee: Secondary | ICD-10-CM | POA: Insufficient documentation

## 2021-12-18 DIAGNOSIS — R262 Difficulty in walking, not elsewhere classified: Secondary | ICD-10-CM | POA: Diagnosis not present

## 2021-12-18 DIAGNOSIS — M25662 Stiffness of left knee, not elsewhere classified: Secondary | ICD-10-CM | POA: Diagnosis not present

## 2021-12-18 NOTE — Therapy (Signed)
[place] [facility] ?[address] ?[place], [place], [address] ?Phone: [phone]   Fax:  [phone] ? ?Physical Therapy Treatment ? ?Patient Details  ?Name: Megan Bryan ?MRN: [id number] ?Date of Birth: Sep 29, 1981 ?Referring Provider (PT): Bjorn Pippin, MD ? ? ?Encounter Date: 12/18/2021 ? ? PT End of Session - 12/18/21 0931   ? ? Visit Number 29   ? Number of Visits 31   ? Date for PT Re-Evaluation 12/18/21   ? Authorization Type [employer]   ? PT Start Time 0930   ? PT Stop Time 1015   ? PT Time Calculation (min) 45 min   ? Activity Tolerance Patient tolerated treatment well   ? Behavior During Therapy [facility] for tasks assessed/performed   ? ?  ?  ? ?  ? ? ?Past Medical History:  ?Diagnosis Date  ? BMI 27.0-27.9,adult   ? ? ?Past Surgical History:  ?Procedure Laterality Date  ? ANTERIOR CRUCIATE LIGAMENT REPAIR Left 06/05/2021  ? Procedure: ANTERIOR CRUCIATE LIGAMENT (ACL) REPAIR with patellar tendon graft;  Surgeon: Bjorn Pippin, MD;  Location: [facility];  Service: Orthopedics;  Laterality: Left;  ? KNEE ARTHROSCOPY WITH LATERAL MENISECTOMY Left 06/05/2021  ? Procedure: KNEE ARTHROSCOPY WITH LATERAL MENISECTOMY;  Surgeon: Bjorn Pippin, MD;  Location: [facility];  Service: Orthopedics;  Laterality: Left;  ? KNEE ARTHROSCOPY WITH MEDIAL MENISECTOMY Left 06/05/2021  ? Procedure: KNEE ARTHROSCOPY WITH MEDIAL MENISECTOMY;  Surgeon: Bjorn Pippin, MD;  Location: [facility];  Service: Orthopedics;  Laterality: Left;  ? TUBAL LIGATION    ? ? ?There were no vitals filed for this visit. ? ? Subjective Assessment - 12/18/21 0932   ? ? Subjective "I have been continuing to run x 3 days a week, and doing exercise at the gym x 3 days. No pain today.   ? Patient Stated Goals To get back to being active, running, and playing basketball.   ? Currently in Pain? No/denies   ? Pain Onset More than a month ago   ? Pain Frequency  Intermittent   ? ?  ?  ? ?  ? ? ? ? ? ? ? ? ? ? ? ? ? ? ? ?[facility] Adult PT Treatment:                                                DATE: 12/18/2021 ? ?Therapeutic Exercise: ?Elliptical L 12, ramp L 10 x 5 min UE/LE ?Standing quad stretch 1 x 30 sec bil, standing hamstring stretch 1 x 30 bil ?Standing calf stretch on slant board 2 x 30 sec ?Standing on LLE with RLE toe taps on while standing on bosue with LLE 2 x 10 ?Neuromuscular re-ed: ?SLS Rebounder 2 x 10 forward, 1 x 15 facing L/ R with  ?Double leg squat on reverse bosu 2 x 10 in // ?Therapeutic Activity: ?High knees for height 2 x 30 ft, 2 x 30 ft for speed ?Single leg skip bounds  2 x 30 ft ?Single leg hps x 3 RLE 8.9 ft, LLE 4.37ft ?Alternating toe taps on bosu with lateral movements 3 to the L and 3 to the R ?Box jumps off 6 inch step bil LE ?Cues for proper form and allow for knees to flex ?Forward single leg box jumps on to  2 inch step ? ? ? ? ? ? ? ? ? ? ? ? ? ? ? ? ? PT Short Term Goals - 08/28/21 0801   ? ?  ? PT SHORT TERM GOAL #1  ? Title The patient will be independent in an initial ACLR home exercise program.   ? Period Weeks   ? Status Achieved   ?  ? PT SHORT TERM GOAL #2  ? Title The patient will present with improvement of left knee flexion active motion to 90 degrees for transfers and ambulation.   ? Status Achieved   ?  ? PT SHORT TERM GOAL #3  ? Title The patient will be able to perform 30 reps of left SLR without an extensor lag for transition to waking without an AD.   ? Status Achieved   ? ?  ?  ? ?  ? ? ? ? PT Long Term Goals - 11/20/21 0855   ? ?  ? PT LONG TERM GOAL #1  ? Title The patient will have an improved FOTO score to 69% or better for return to premorbid activities.   ? Baseline 34%   ? Time 12   ? Status On-going   ? Target Date 12/04/21   ?  ? PT LONG TERM GOAL #2  ? Title The patient will be able to perform an eight inch step down with no compensation and good left knee eccentric control for stairs   ? Baseline step to pattern  w/ crutches; 08/14/21- improving control with step down, 08/26/21: can perform 6 inch step down; 11/20/21: unable   ? Time 12   ? Period Weeks   ? Status On-going   ? Target Date 12/04/21   ?  ? PT LONG TERM GOAL #3  ? Title The patient will be able to perform a single leg stance on foam x 30 second or greater for return to community work with childern.   ? Baseline >30 sec on foam   ? Time 12   ? Period Weeks   ? Status Achieved   ?  ? PT LONG TERM GOAL #4  ? Title The patient will be able to squat to full depth with equal weight bearing for lifting.   ? Baseline improving ROM left knee; weight shifts to the right   ? Time 12   ? Period Weeks   ? Status On-going   ?  ? PT LONG TERM GOAL #5  ? Title The patient will be able to complete a functonal hop test or Y balance testing and score a 90% or better for return to recreational and community activities.   ? Baseline not tested   ? Time 18   ? Period Weeks   ? Status On-going   ? Target Date 12/04/21   ? ?  ?  ? ?  ? ? ? ? ? ? ? ? Plan - 12/18/21 0937   ? ? Clinical Impression Statement Tytianna continues to make great progress with physical therapy and her dynamic mobility. Continued focus on balance training and progressing plyometrics with jumping off 6 inch step with bil LE, and jumping up onto 2 inch step with LLE. she does continue to demonstart apprehension with initial exercise but is able to perform the activity. plan to reassess next session.   ? Examination-Activity Limitations Bathing;Locomotion Level;Transfers;Bed Mobility;Caring for Others;Carry;Sleep;Dressing;Lift;Stand;Stairs;Squat   ? PT Treatment/Interventions ADLs/Self Care Home Management;Aquatic Therapy;Cryotherapy;Electrical Stimulation;Moist Heat;Gait training;Stair training;Functional mobility training;Therapeutic activities;Therapeutic exercise;Balance training;Neuromuscular  re-education;Manual techniques;Patient/family education;Passive range of motion;Dry needling;Joint Manipulations;Spinal  Manipulations;Taping   ? PT Next Visit Plan ERO, lateral jumping double limb, single limb, quick feet, gross strengthening,   beginner plyo; continue jumping on reformer 2 red springs, pt is 22 weeks post op on 11/06/2021   ? PT Home Exercise Plan Access Code: [email or web address]   ? ?  ?  ? ?  ? ? ?Patient will benefit from skilled therapeutic intervention in order to improve the following deficits and impairments:  Abnormal gait, Decreased range of motion, Difficulty walking, Pain, Decreased balance, Impaired flexibility, Improper body mechanics, Decreased mobility, Decreased strength, Increased edema ? ?Visit Diagnosis: ?Decreased range of motion of left knee ? ?Difficulty in walking, not elsewhere classified ? ?Muscle weakness (generalized) ? ?Acute pain of left knee ? ? ? ? ?Problem List ?Patient Active Problem List  ? Diagnosis Date Noted  ? BMI 27.0-27.9,adult   ? ? ?Lulu Riding PT, DPT, LAT, ATC  ?12/18/21  11:38 AM ? ? ? ? ? ?[place] [facility] ?[address] ?[place], [place], [address] ?Phone: [phone]   Fax:  [phone] ? ?Name: LEAANNE RUPRIGHT ?MRN: [id number] ?Date of Birth: 1981/05/17 ? ? ? ?

## 2021-12-24 DIAGNOSIS — S83282D Other tear of lateral meniscus, current injury, left knee, subsequent encounter: Secondary | ICD-10-CM | POA: Diagnosis not present

## 2022-01-01 ENCOUNTER — Ambulatory Visit: Payer: 59 | Admitting: Physical Therapy

## 2022-01-01 ENCOUNTER — Other Ambulatory Visit: Payer: Self-pay

## 2022-01-01 DIAGNOSIS — M6281 Muscle weakness (generalized): Secondary | ICD-10-CM | POA: Diagnosis not present

## 2022-01-01 DIAGNOSIS — R262 Difficulty in walking, not elsewhere classified: Secondary | ICD-10-CM | POA: Diagnosis not present

## 2022-01-01 DIAGNOSIS — M25562 Pain in left knee: Secondary | ICD-10-CM | POA: Diagnosis not present

## 2022-01-01 DIAGNOSIS — M25662 Stiffness of left knee, not elsewhere classified: Secondary | ICD-10-CM

## 2022-01-01 NOTE — Therapy (Signed)
?[facility] ?[address] ?[place], [place], [address] ?Phone: [phone]   Fax:  [phone] ? ?Physical Therapy Treatment / Re-certification ? ?Patient Details  ?Name: Megan Bryan ?MRN: [id number] ?Date of Birth: [birth year] ?Referring Provider (PT): Bjorn PippinVarkey, Dax T, MD ? ? ?Encounter Date: 01/01/2022 ? ? PT End of Session - 01/01/22 0929   ? ? Visit Number 30   ? Number of Visits 35   ? Date for PT Re-Evaluation 02/12/22   ? Authorization Type [employer]   ? PT Start Time [phone]   ? PT Stop Time 1015   ? PT Time Calculation (min) 47 min   ? Activity Tolerance Patient tolerated treatment well   ? [facility] assessed/performed   ? ?  ?  ? ?  ? ? ?Past Medical History:  ?Diagnosis Date  ? BMI 27.0-27.9,adult   ? ? ?Past Surgical History:  ?Procedure Laterality Date  ? ANTERIOR CRUCIATE LIGAMENT REPAIR Left 06/05/2021  ? Procedure: ANTERIOR CRUCIATE LIGAMENT (ACL) REPAIR with patellar tendon graft;  Surgeon: Bjorn PippinVarkey, Dax T, MD;  Location: [facility];  Service: Orthopedics;  Laterality: Left;  ? KNEE ARTHROSCOPY WITH LATERAL MENISECTOMY Left 06/05/2021  ? Procedure: KNEE ARTHROSCOPY WITH LATERAL MENISECTOMY;  Surgeon: Bjorn PippinVarkey, Dax T, MD;  Location: [facility];  Service: Orthopedics;  Laterality: Left;  ? KNEE ARTHROSCOPY WITH MEDIAL MENISECTOMY Left 06/05/2021  ? Procedure: KNEE ARTHROSCOPY WITH MEDIAL MENISECTOMY;  Surgeon: Bjorn PippinVarkey, Dax T, MD;  Location: [facility];  Service: Orthopedics;  Laterality: Left;  ? TUBAL LIGATION    ? ? ?There were no vitals filed for this visit. ? ? Subjective Assessment - 01/01/22 0930   ? ? Subjective " doing pretty good, no pain. I am getting more confident with jumping, I've been doing all the exercise and activity.   ? Patient Stated Goals To get back to being active, running, and playing basketball.   ? Currently in Pain? No/denies   ? Pain Onset More than a  month ago   ? Aggravating Factors  N/A   ? Pain Relieving Factors N/A   ? ?  ?  ? ?  ? ? ? ? ? [facility] PT Assessment - 01/01/22 0001   ? ?  ? Assessment  ? Medical Diagnosis s/p L Knee scope lateral meniscectomy, ACL repair, Laeral meniscus repair SX 06/05/21   ? Referring Provider (PT) Bjorn PippinVarkey, Dax T, MD   ?  ? Observation/Other Assessments  ? Focus on Therapeutic Outcomes (FOTO)  86%   ? ?  ?  ? ?  ? ? ? ? ? ? ? ? ?[facility] Adult PT Treatment:                                                DATE: 01/01/2022 ? ?Therapeutic Exercise: ?Pistol squat LLE using TRX 2 x 10 ?Standing on LLE with RLE toe taps on while standing on bosue with LLE 2 x 10 ?Neuromuscular re-ed: ?SLS Rebounder 2 x 10 forward, 1 x 15 facing L/ R with  ?Therapeutic Activity: ?High knees for height 2 x 2 x for speed using ladder ?Ladder double limb lateral hopping x 2 ?Ladder single leg hops x 2 only going 1/2 way ?Hurdle hops jumping off RLE/ LLE over hurdle 2 x 20 ?Forward  single leg box jumps on to 4 inch step ?Utilized RLE first x 5 reps to promote comfort ? ? ? ? ? ? ? ? ? ? ? ? ? ? ? ? ? ? PT Short Term Goals - 01/01/22 0932   ? ?  ? PT SHORT TERM GOAL #1  ? Title The patient will be independent in an initial ACLR home exercise program.   ? Status Achieved   ?  ? PT SHORT TERM GOAL #2  ? Title The patient will present with improvement of left knee flexion active motion to 90 degrees for transfers and ambulation.   ? Status Achieved   ?  ? PT SHORT TERM GOAL #3  ? Title The patient will be able to perform 30 reps of left SLR without an extensor lag for transition to waking without an AD.   ? Status Achieved   ? ?  ?  ? ?  ? ? ? ? PT Long Term Goals - 01/01/22 0932   ? ?  ? PT LONG TERM GOAL #1  ? Title The patient will have an improved FOTO score to 69% or better for return to premorbid activities.   ? Period Weeks   ? Status Achieved   ?  ? PT LONG TERM GOAL #2  ? Title The patient will be able to perform an eight inch step down with no compensation and  good left knee eccentric control for stairs   ?  ? PT LONG TERM GOAL #3  ? Title The patient will be able to perform a single leg stance on foam x 30 second or greater for return to community work with childern.   ? Status Achieved   ?  ? PT LONG TERM GOAL #4  ? Title The patient will be able to squat to full depth with equal weight bearing for lifting.   ?  ? PT LONG TERM GOAL #5  ? Title The patient will be able to complete a functonal hop test or Y balance testing and score a 90% or better for return to recreational and community activities.   ? Period Weeks   ? Status Achieved   ? ?  ?  ? ?  ? ? ? ? ? ? ? ? Plan - 01/01/22 1106   ? ? Clinical Impression Statement Megan Bryan reports no pain or aggrivation and is consistent with her gym / running routnine and is making great progress with physical therapy, she is 7 months and 2 weeks post op.  She does continue to demonstated mild stiffness in the knee but rmaines WNL regarding ROM. Continued focus on higher level activities including double/ single leg forward and lateral hops, with some apprehension noted with LLE single leg hopping, but notes she is consistently working on it at home and notes improvementin strength as well as confidence. she would benefit from continued physical therapy to progress higher level dynamic activities, and maximize her function continuing with 1 x every 2 weeks.   ? Examination-Activity Limitations Bathing;Locomotion Level;Transfers;Bed Mobility;Caring for Others;Carry;Sleep;Dressing;Lift;Stand;Stairs;Squat   ? Examination-Participation Restrictions Cleaning;Church;Meal Prep;Occupation;Driving;Yard Work;Volunteer;Laundry;Shop;Community Activity   ? PT Frequency Biweekly   ? PT Duration 6 weeks   ? PT Treatment/Interventions ADLs/Self Care Home Management;Aquatic Therapy;Cryotherapy;Electrical Stimulation;Moist Heat;Gait training;Stair training;Functional mobility training;Therapeutic activities;Therapeutic exercise;Balance  training;Neuromuscular re-education;Manual techniques;Patient/family education;Passive range of motion;Dry needling;Joint Manipulations;Spinal Manipulations;Taping   ? PT Next Visit Plan forward/ lateral hops, balance training, single leg strengthening.   ? PT Home Exercise Plan Access  Code: [email or web address]   ? Consulted and Agree with Plan of Care Patient   ? ?  ?  ? ?  ? ? ?Patient will benefit from skilled therapeutic intervention in order to improve the following deficits and impairments:  Abnormal gait, Decreased range of motion, Difficulty walking, Pain, Decreased balance, Impaired flexibility, Improper body mechanics, Decreased mobility, Decreased strength, Increased edema ? ?Visit Diagnosis: ?Decreased range of motion of left knee - Plan: PT plan of care cert/re-cert ? ?Difficulty in walking, not elsewhere classified - Plan: PT plan of care cert/re-cert ? ?Muscle weakness (generalized) - Plan: PT plan of care cert/re-cert ? ?Acute pain of left knee - Plan: PT plan of care cert/re-cert ? ? ? ? ?Problem List ?Patient Active Problem List  ? Diagnosis Date Noted  ? BMI 27.0-27.9,adult   ? ?Deylan Canterbury PT, DPT, LAT, ATC  ?01/01/22  11:16 AM ? ? ? ? ? ?[place] ?[facility] ?[address] ?[place], [place], [address] ?Phone: [phone]   Fax:  [phone] ? ?Name: Megan Bryan ?MRN: [id number] ?Date of Birth: 02-18-1981 ? ? ? ?

## 2022-01-15 ENCOUNTER — Encounter: Payer: Self-pay | Admitting: Physical Therapy

## 2022-01-15 ENCOUNTER — Other Ambulatory Visit: Payer: Self-pay

## 2022-01-15 ENCOUNTER — Ambulatory Visit: Payer: 59 | Admitting: Physical Therapy

## 2022-01-15 DIAGNOSIS — R262 Difficulty in walking, not elsewhere classified: Secondary | ICD-10-CM | POA: Diagnosis not present

## 2022-01-15 DIAGNOSIS — M6281 Muscle weakness (generalized): Secondary | ICD-10-CM

## 2022-01-15 DIAGNOSIS — M25662 Stiffness of left knee, not elsewhere classified: Secondary | ICD-10-CM

## 2022-01-15 DIAGNOSIS — M25562 Pain in left knee: Secondary | ICD-10-CM | POA: Diagnosis not present

## 2022-01-15 NOTE — Therapy (Signed)
[place] ?[facility] ?[address] ?[place], [place], [address] ?Phone: [phone]   Fax:  [phone] ? ?Physical Therapy Treatment ? ?Patient Details  ?Name: Megan Bryan ?MRN: [id number] ?Date of Birth: 02-21-1981 ?Referring Provider (PT): Hiram Gash, MD ? ? ?Encounter Date: 01/15/2022 ? ? PT End of Session - 01/15/22 0926   ? ? Visit Number 31   ? Number of Visits 35   ? Date for PT Re-Evaluation 02/12/22   ? Authorization Type [employer]   ? PT Start Time [phone]   ? PT Stop Time 1018   ? PT Time Calculation (min) 51 min   ? Activity Tolerance Patient tolerated treatment well   ? Behavior During Therapy [facility] for tasks assessed/performed   ? ?  ?  ? ?  ? ? ?Past Medical History:  ?Diagnosis Date  ? BMI 27.0-27.9,adult   ? ? ?Past Surgical History:  ?Procedure Laterality Date  ? ANTERIOR CRUCIATE LIGAMENT REPAIR Left 06/05/2021  ? Procedure: ANTERIOR CRUCIATE LIGAMENT (ACL) REPAIR with patellar tendon graft;  Surgeon: Hiram Gash, MD;  Location: [place];  Service: Orthopedics;  Laterality: Left;  ? KNEE ARTHROSCOPY WITH LATERAL MENISECTOMY Left 06/05/2021  ? Procedure: KNEE ARTHROSCOPY WITH LATERAL MENISECTOMY;  Surgeon: Hiram Gash, MD;  Location: [place];  Service: Orthopedics;  Laterality: Left;  ? KNEE ARTHROSCOPY WITH MEDIAL MENISECTOMY Left 06/05/2021  ? Procedure: KNEE ARTHROSCOPY WITH MEDIAL MENISECTOMY;  Surgeon: Hiram Gash, MD;  Location: [place];  Service: Orthopedics;  Laterality: Left;  ? TUBAL LIGATION    ? ? ?There were no vitals filed for this visit. ? ? Subjective Assessment - 01/15/22 0929   ? ? Subjective "Im am doing okay, I've been having issues with family."   ? Currently in Pain? No/denies   ? Pain Orientation Left   ? Pain Frequency Intermittent   ? Multiple Pain Sites No   ? ?  ?  ? ?  ? ? ? ? ? ? ? ? ? ? ? ? ? ? ? ? ? ?[facility] Adult PT Treatment:                                                 DATE: 01/15/2022 ? ?Therapeutic Exercise: ?Treadmill running L4.0 x 5 min ?Standing calf stretch 1 x 30 knee extend, 1x 30 sec with knee flexed ?Standing quad stretch 1 x 30 sec ?Dead lift 2 x 10 with 75#, 1 x 8 with 85# (halted exercise due to pt feeling clammy and fatigued) ? ?Neuromuscular re-ed: ?SLS on airex with Rebounder toss using yellow ball 2 x 15 ?SLS on airex using tidal tank lateral motions 1 x 15 ?Therapeutic Activity: ?Lateral jumping over line 2 x 10 LLE  ? ? ? ? ? ? ? ? ? ? ? ? ? ? ? PT Short Term Goals - 01/01/22 0932   ? ?  ? PT SHORT TERM GOAL #1  ? Title The patient will be independent in an initial ACLR home exercise program.   ? Status Achieved   ?  ? PT SHORT TERM GOAL #2  ? Title The patient will present with improvement of left knee flexion active motion to 90 degrees for transfers and ambulation.   ? Status Achieved   ?  ?  PT SHORT TERM GOAL #3  ? Title The patient will be able to perform 30 reps of left SLR without an extensor lag for transition to waking without an AD.   ? Status Achieved   ? ?  ?  ? ?  ? ? ? ? PT Long Term Goals - 01/01/22 0932   ? ?  ? PT LONG TERM GOAL #1  ? Title The patient will have an improved FOTO score to 69% or better for return to premorbid activities.   ? Period Weeks   ? Status Achieved   ?  ? PT LONG TERM GOAL #2  ? Title The patient will be able to perform an eight inch step down with no compensation and good left knee eccentric control for stairs   ?  ? PT LONG TERM GOAL #3  ? Title The patient will be able to perform a single leg stance on foam x 30 second or greater for return to community work with childern.   ? Status Achieved   ?  ? PT LONG TERM GOAL #4  ? Title The patient will be able to squat to full depth with equal weight bearing for lifting.   ?  ? PT LONG TERM GOAL #5  ? Title The patient will be able to complete a functonal hop test or Y balance testing and score a 90% or better for return to recreational and community activities.    ? Period Weeks   ? Status Achieved   ? ?  ?  ? ?  ? ? ? ? ? ? ? ? Plan - 01/15/22 1031   ? ? Clinical Impression Statement pt continues to do great with her home exercises with running/ jumping. Continued working on balance training which she did well with and is demosntrting improvement in balance. continued working on strengthening via dead lift with barbell and weight, she did well with but started feeling clammy/ nauseated which occurred likely from not eating earlier this morning. Halted exercise and monitored providing water and supplmenting candy for sugar which she noted improvement and was able to resume performing jumping to finish the session.   ? Examination-Activity Limitations Bathing;Locomotion Level;Transfers;Bed Mobility;Caring for Others;Carry;Sleep;Dressing;Lift;Stand;Stairs;Squat   ? Examination-Participation Restrictions Cleaning;Church;Meal Prep;Occupation;Driving;Yard Work;Volunteer;Laundry;Shop;Community Activity   ? PT Treatment/Interventions ADLs/Self Care Home Management;Aquatic Therapy;Cryotherapy;Electrical Stimulation;Moist Heat;Gait training;Stair training;Functional mobility training;Therapeutic activities;Therapeutic exercise;Balance training;Neuromuscular re-education;Manual techniques;Patient/family education;Passive range of motion;Dry needling;Joint Manipulations;Spinal Manipulations;Taping   ? PT Next Visit Plan forward/ lateral hops, balance training, single leg strengthening.   ? PT Home Exercise Plan Access Code: [email or web address]   ? Consulted and Agree with Plan of Care Patient   ? ?  ?  ? ?  ? ? ?Patient will benefit from skilled therapeutic intervention in order to improve the following deficits and impairments:  Abnormal gait, Decreased range of motion, Difficulty walking, Pain, Decreased balance, Impaired flexibility, Improper body mechanics, Decreased mobility, Decreased strength, Increased edema ? ?Visit Diagnosis: ?Decreased range of motion of left knee ? ?Difficulty in  walking, not elsewhere classified ? ?Muscle weakness (generalized) ? ?Acute pain of left knee ? ? ? ? ?Problem List ?Patient Active Problem List  ? Diagnosis Date Noted  ? BMI 27.0-27.9,adult   ? ?Grady Mohabir PT, DPT, LAT, ATC  ?01/15/22  10:36 AM ? ? ? ? ? ?[place] ?[facility] ?[address] ?[place], [place], [address] ?Phone: [phone]   Fax:  [phone] ? ?Name: SEELA SCHILZ ?MRN: [id number] ?Date of Birth:  09/08/1981 ? ? ? ?

## 2022-01-29 ENCOUNTER — Ambulatory Visit: Payer: 59 | Attending: Orthopaedic Surgery | Admitting: Physical Therapy

## 2022-01-29 ENCOUNTER — Encounter: Payer: Self-pay | Admitting: Physical Therapy

## 2022-01-29 DIAGNOSIS — M6281 Muscle weakness (generalized): Secondary | ICD-10-CM | POA: Diagnosis not present

## 2022-01-29 DIAGNOSIS — R262 Difficulty in walking, not elsewhere classified: Secondary | ICD-10-CM | POA: Insufficient documentation

## 2022-01-29 DIAGNOSIS — M25662 Stiffness of left knee, not elsewhere classified: Secondary | ICD-10-CM | POA: Diagnosis not present

## 2022-01-29 NOTE — Therapy (Signed)
[place] ?[facility] ?[address] ?[place], [place], [address] ?Phone: [phone]   Fax:  [phone] ? ?Physical Therapy Treatment ? ?Patient Details  ?Name: Megan Bryan ?MRN: [id number] ?Date of Birth: [birth year] ?Referring Provider (PT): Bjorn PippinVarkey, Dax T, MD ? ? ?Encounter Date: 01/29/2022 ? ? PT End of Session - 01/29/22 0940   ? ? Visit Number 32   ? Number of Visits 35   ? Date for PT Re-Evaluation 02/12/22   ? Authorization Type [employer]   ? PT Start Time 0940   pt arrived 10 min late  ? PT Stop Time 1015   ? PT Time Calculation (min) 35 min   ? Activity Tolerance Patient tolerated treatment well   ? Behavior [facility] assessed/performed   ? ?  ?  ? ?  ? ? ?Past Medical History:  ?Diagnosis Date  ? BMI 27.0-27.9,adult   ? ? ?Past Surgical History:  ?Procedure Laterality Date  ? ANTERIOR CRUCIATE LIGAMENT REPAIR Left 06/05/2021  ? Procedure: ANTERIOR CRUCIATE LIGAMENT (ACL) REPAIR with patellar tendon graft;  Surgeon: Bjorn PippinVarkey, Dax T, MD;  Location: [facility];  Service: Orthopedics;  Laterality: Left;  ? KNEE ARTHROSCOPY WITH LATERAL MENISECTOMY Left 06/05/2021  ? Procedure: KNEE ARTHROSCOPY WITH LATERAL MENISECTOMY;  Surgeon: Bjorn PippinVarkey, Dax T, MD;  Location: [facility];  Service: Orthopedics;  Laterality: Left;  ? KNEE ARTHROSCOPY WITH MEDIAL MENISECTOMY Left 06/05/2021  ? Procedure: KNEE ARTHROSCOPY WITH MEDIAL MENISECTOMY;  Surgeon: Bjorn PippinVarkey, Dax T, MD;  Location: [facility];  Service: Orthopedics;  Laterality: Left;  ? TUBAL LIGATION    ? ? ?There were no vitals filed for this visit. ? ? Subjective Assessment - 01/29/22 0943   ? ? Subjective " I am doin gpretty good. I wore high heels recently and I noticed I was sore alittle. I did some exercise following to keep moving/ loose."   ? Patient Stated Goals To get back to being active, running, and playing basketball.   ? Currently in Pain? No/denies    ? ?  ?  ? ?  ? ? ? ? ? ? ? ? ? ? ?[facility] Adult PT Treatment:                                                DATE: 01/29/2022 ? ?Therapeutic Exercise: ?Tredmill 4.0 x 5 min  ?Standing quad stretch bil 1 x 30 sec ?Hamstring stretch 1 x 30 sec ?Pstanding calf stretch 2 x 30 sec 1 x knee bent, 1 x knee extend  ?Dead lift with hex bar 2 x 10 95# ? ?Therapeutic Activity: ?Jumping bil for height 1 x 10 (cues for equal landing and let knee bend to cushion landing) ?Box jumps onto 1 x 5 bil 2inch, 4 inch, 1 x 10 forward&  1 x 10 lateral 2 inch LLE only. Repeated with 4 inch step ?Jumping off 8 inch box 2 x 10 with bil LE with loading and jumping up after landing  ? ? ? ? ? ? ? ? ? ? ? ? ? ? ? ? ? ? ? ? ? ? ? PT Short Term Goals - 01/01/22 0932   ? ?  ? PT SHORT TERM GOAL #1  ? Title The patient will be independent in an initial ACLR home  exercise program.   ? Status Achieved   ?  ? PT SHORT TERM GOAL #2  ? Title The patient will present with improvement of left knee flexion active motion to 90 degrees for transfers and ambulation.   ? Status Achieved   ?  ? PT SHORT TERM GOAL #3  ? Title The patient will be able to perform 30 reps of left SLR without an extensor lag for transition to waking without an AD.   ? Status Achieved   ? ?  ?  ? ?  ? ? ? ? PT Long Term Goals - 01/01/22 0932   ? ?  ? PT LONG TERM GOAL #1  ? Title The patient will have an improved FOTO score to 69% or better for return to premorbid activities.   ? Period Weeks   ? Status Achieved   ?  ? PT LONG TERM GOAL #2  ? Title The patient will be able to perform an eight inch step down with no compensation and good left knee eccentric control for stairs   ?  ? PT LONG TERM GOAL #3  ? Title The patient will be able to perform a single leg stance on foam x 30 second or greater for return to community work with childern.   ? Status Achieved   ?  ? PT LONG TERM GOAL #4  ? Title The patient will be able to squat to full depth with equal weight bearing for lifting.   ?   ? PT LONG TERM GOAL #5  ? Title The patient will be able to complete a functonal hop test or Y balance testing and score a 90% or better for return to recreational and community activities.   ? Period Weeks   ? Status Achieved   ? ?  ?  ? ?  ? ? ? ? ? ? ? ? Plan - 01/29/22 1014   ? ? Clinical Impression Statement pt reported some soreness in the knee recently which was secondary to wearing high heels but reports no pain coming in today. Focsued session primarily on jumping using a 2, and 4 inch step for single leg hopping onto the box, and 8 inch for hopping off the box with reciprocal jump. Overall she did very well with exercises today demonstrating good control of the knee when landing, and mild postural sway. plan to work into running with cutting next session.   ? Examination-Activity Limitations Bathing;Locomotion Level;Transfers;Bed Mobility;Caring for Others;Carry;Sleep;Dressing;Lift;Stand;Stairs;Squat   ? Examination-Participation Restrictions Cleaning;Church;Meal Prep;Occupation;Driving;Yard Work;Volunteer;Laundry;Shop;Community Activity   ? PT Treatment/Interventions ADLs/Self Care Home Management;Aquatic Therapy;Cryotherapy;Electrical Stimulation;Moist Heat;Gait training;Stair training;Functional mobility training;Therapeutic activities;Therapeutic exercise;Balance training;Neuromuscular re-education;Manual techniques;Patient/family education;Passive range of motion;Dry needling;Joint Manipulations;Spinal Manipulations;Taping   ? PT Next Visit Plan forward/ lateral hops, balance training, single leg strengthening.   ? PT Home Exercise Plan Access Code: [email or web address]   ? Consulted and Agree with Plan of Care Patient   ? ?  ?  ? ?  ? ? ?Patient will benefit from skilled therapeutic intervention in order to improve the following deficits and impairments:  Abnormal gait, Decreased range of motion, Difficulty walking, Pain, Decreased balance, Impaired flexibility, Improper body mechanics, Decreased mobility,  Decreased strength, Increased edema ? ?Visit Diagnosis: ?No diagnosis found. ? ? ? ? ?Problem List ?Patient Active Problem List  ? Diagnosis Date Noted  ? BMI 27.0-27.9,adult   ? ?Yannis Broce PT, DPT, LAT, ATC  ?01/29/22  10:17 AM ? ? ? ? ? ?[place] ?[facility] ?[address] ?[place], [place], [address] ?Phone: [phone]   Fax:  [phone] ? ?Name: Megan Bryan ?MRN: [id number] ?Date of Birth: October 24, 1980 ? ? ? ?

## 2022-02-12 ENCOUNTER — Ambulatory Visit: Payer: 59 | Admitting: Physical Therapy

## 2022-02-12 ENCOUNTER — Encounter: Payer: Self-pay | Admitting: Physical Therapy

## 2022-02-12 DIAGNOSIS — R262 Difficulty in walking, not elsewhere classified: Secondary | ICD-10-CM | POA: Diagnosis not present

## 2022-02-12 DIAGNOSIS — M6281 Muscle weakness (generalized): Secondary | ICD-10-CM | POA: Diagnosis not present

## 2022-02-12 DIAGNOSIS — M25662 Stiffness of left knee, not elsewhere classified: Secondary | ICD-10-CM

## 2022-02-12 NOTE — Therapy (Signed)
[place] ?[facility] ?[address] ?[place], [place], [address] ?Phone: [phone]   Fax:  [phone] ? ?Physical Therapy Treatment ? ?Patient Details  ?Name: Megan Bryan ?MRN: [id number] ?Date of Birth: 1980/12/17 ?Referring Provider (PT): Hiram Gash, MD ? ? ?Encounter Date: 02/12/2022 ? ? PT End of Session - 02/12/22 0936   ? ? Visit Number 33   ? Number of Visits 35   ? Date for PT Re-Evaluation 02/12/22   ? Authorization Type [employer]   ? PT Start Time [phone]   ? PT Stop Time 1011   ? PT Time Calculation (min) 38 min   ? Activity Tolerance Patient tolerated treatment well   ? Behavior During Therapy [facility] for tasks assessed/performed   ? ?  ?  ? ?  ? ? ?Past Medical History:  ?Diagnosis Date  ? BMI 27.0-27.9,adult   ? ? ?Past Surgical History:  ?Procedure Laterality Date  ? ANTERIOR CRUCIATE LIGAMENT REPAIR Left 06/05/2021  ? Procedure: ANTERIOR CRUCIATE LIGAMENT (ACL) REPAIR with patellar tendon graft;  Surgeon: Hiram Gash, MD;  Location: [place];  Service: Orthopedics;  Laterality: Left;  ? KNEE ARTHROSCOPY WITH LATERAL MENISECTOMY Left 06/05/2021  ? Procedure: KNEE ARTHROSCOPY WITH LATERAL MENISECTOMY;  Surgeon: Hiram Gash, MD;  Location: [place];  Service: Orthopedics;  Laterality: Left;  ? KNEE ARTHROSCOPY WITH MEDIAL MENISECTOMY Left 06/05/2021  ? Procedure: KNEE ARTHROSCOPY WITH MEDIAL MENISECTOMY;  Surgeon: Hiram Gash, MD;  Location: [place];  Service: Orthopedics;  Laterality: Left;  ? TUBAL LIGATION    ? ? ?There were no vitals filed for this visit. ? ? Subjective Assessment - 02/12/22 0937   ? ? Subjective " Im still working on strengthening and some hopping (I could do more). My knee does get sore after the work 1 day but otherwise it tends to go away after that."   ? Currently in Pain? No/denies   ? ?  ?  ? ?  ? ? ? ? ? ? ? ? ? ? ? ? ? ? ? ?[facility] Adult PT Treatment:                                                 DATE: 02/12/2022 ? ?Therapeutic Exercise: ?Treadmil 4.2 x 5 min  ?Quad stretch standing bil 1 x 30 sec standing ?Hamstring stretch 1 x 30 sec bil ?Standing calf stretch 1 x 30 sec, 1 x 30 sec with knees flexed ? ?Therapeutic Activity: ?Alternating toe taps on 6 inch curb 3 x 30 seconds - cues to keep knees flexed and utilized reciprocal arm movement as well ?Jumping off 10 inch step focusing on landing with knee flexion and reciprocal jumping 2 x 5, verbal cues to required to slow down and focus on landing before jumping.  ?Shuttle run 4 x, cues / demonstration for proper form ? ? ? ? ? ? ? ? ? ? ? ? ? ? ? ? PT Short Term Goals - 01/01/22 0932   ? ?  ? PT SHORT TERM GOAL #1  ? Title The patient will be independent in an initial ACLR home exercise program.   ? Status Achieved   ?  ? PT SHORT TERM GOAL #2  ? Title The patient will present with improvement of left  knee flexion active motion to 90 degrees for transfers and ambulation.   ? Status Achieved   ?  ? PT SHORT TERM GOAL #3  ? Title The patient will be able to perform 30 reps of left SLR without an extensor lag for transition to waking without an AD.   ? Status Achieved   ? ?  ?  ? ?  ? ? ? ? PT Long Term Goals - 01/01/22 0932   ? ?  ? PT LONG TERM GOAL #1  ? Title The patient will have an improved FOTO score to 69% or better for return to premorbid activities.   ? Period Weeks   ? Status Achieved   ?  ? PT LONG TERM GOAL #2  ? Title The patient will be able to perform an eight inch step down with no compensation and good left knee eccentric control for stairs   ?  ? PT LONG TERM GOAL #3  ? Title The patient will be able to perform a single leg stance on foam x 30 second or greater for return to community work with childern.   ? Status Achieved   ?  ? PT LONG TERM GOAL #4  ? Title The patient will be able to squat to full depth with equal weight bearing for lifting.   ?  ? PT LONG TERM GOAL #5  ? Title The patient will be able to  complete a functonal hop test or Y balance testing and score a 90% or better for return to recreational and community activities.   ? Period Weeks   ? Status Achieved   ? ?  ?  ? ?  ? ? ? ? ? ? ? ? Plan - 02/12/22 1218   ? ? Clinical Impression Statement Megan Bryan to make excellent progress with physical therapy. Today focused on high level dynmaic activity including running / cutting and hopping. Megan Bryan does continue to be apprehensive of her knee despite no pain. Megan Bryan was able to perform running and cutting to both L/R and sprinting activities requiring verbal cues and demosntration for proper form. Overall Megan Bryan is doing excellent and plan to reassess next session and anticipate discharge at that time.   ? Examination-Activity Limitations Bathing;Locomotion Level;Transfers;Bed Mobility;Caring for Others;Carry;Sleep;Dressing;Lift;Stand;Stairs;Squat   ? Examination-Participation Restrictions Cleaning;Church;Meal Prep;Occupation;Driving;Yard Work;Volunteer;Laundry;Shop;Community Activity   ? PT Treatment/Interventions ADLs/Self Care Home Management;Aquatic Therapy;Cryotherapy;Electrical Stimulation;Moist Heat;Gait training;Stair training;Functional mobility training;Therapeutic activities;Therapeutic exercise;Balance training;Neuromuscular re-education;Manual techniques;Patient/family education;Passive range of motion;Dry needling;Joint Manipulations;Spinal Manipulations;Taping   ? PT Next Visit Plan ERO / Discharge ? progression HEP   ? PT Home Exercise Plan Access Code: [email or web address]   ? Consulted and Agree with Plan of Care Patient   ? ?  ?  ? ?  ? ? ?Patient will benefit from skilled therapeutic intervention in order to improve the following deficits and impairments:  Abnormal gait, Decreased range of motion, Difficulty walking, Pain, Decreased balance, Impaired flexibility, Improper body mechanics, Decreased mobility, Decreased strength, Increased edema ? ?Visit Diagnosis: ?Decreased range of motion of left  knee ? ?Difficulty in walking, not elsewhere classified ? ? ? ? ?Problem List ?Patient Active Problem List  ? Diagnosis Date Noted  ? BMI 27.0-27.9,adult   ? ? ?Starr Lake PT, DPT, LAT, ATC  ?02/12/22  12:26 PM ? ? ? ? ? ? ?[facility] ?[address] ?[place], [place], [address] ?Phone: [phone]   Fax:  [phone] ? ?Name: EMILENE STARRETT ?MRN: [id number] ?Date of Birth:  05/22/1981 ? ? ? ?

## 2022-02-18 DIAGNOSIS — S83282D Other tear of lateral meniscus, current injury, left knee, subsequent encounter: Secondary | ICD-10-CM | POA: Diagnosis not present

## 2022-02-26 ENCOUNTER — Ambulatory Visit: Payer: 59 | Admitting: Physical Therapy

## 2022-03-05 ENCOUNTER — Ambulatory Visit: Payer: 59 | Attending: Orthopaedic Surgery | Admitting: Physical Therapy

## 2022-03-05 ENCOUNTER — Encounter: Payer: Self-pay | Admitting: Physical Therapy

## 2022-03-05 DIAGNOSIS — M6281 Muscle weakness (generalized): Secondary | ICD-10-CM | POA: Diagnosis not present

## 2022-03-05 DIAGNOSIS — R262 Difficulty in walking, not elsewhere classified: Secondary | ICD-10-CM | POA: Insufficient documentation

## 2022-03-05 DIAGNOSIS — M25662 Stiffness of left knee, not elsewhere classified: Secondary | ICD-10-CM | POA: Diagnosis not present

## 2022-03-05 NOTE — Therapy (Signed)
[place] ?[facility] ?[address] ?[place], [place], [address] ?Phone: [phone]   Fax:  [phone] ? ?Physical Therapy Treatment / Discharge note ? ?Patient Details  ?Name: Megan Bryan ?MRN: [id number] ?Date of Birth: 1981/06/26 ?Referring Provider (PT): Hiram Gash, MD ? ? ?Encounter Date: 03/05/2022 ? ? PT End of Session - 03/05/22 0937   ? ? Visit Number 34   ? Number of Visits 35   ? Date for PT Re-Evaluation 03/05/22   ? Authorization Type [employer]   ? PT Start Time [phone]   ? PT Stop Time 1019   ? PT Time Calculation (min) 45 min   ? Activity Tolerance Patient tolerated treatment well   ? Behavior During Therapy [facility] for tasks assessed/performed   ? ?  ?  ? ?  ? ? ?Past Medical History:  ?Diagnosis Date  ? BMI 27.0-27.9,adult   ? ? ?Past Surgical History:  ?Procedure Laterality Date  ? ANTERIOR CRUCIATE LIGAMENT REPAIR Left 06/05/2021  ? Procedure: ANTERIOR CRUCIATE LIGAMENT (ACL) REPAIR with patellar tendon graft;  Surgeon: Hiram Gash, MD;  Location: [place];  Service: Orthopedics;  Laterality: Left;  ? KNEE ARTHROSCOPY WITH LATERAL MENISECTOMY Left 06/05/2021  ? Procedure: KNEE ARTHROSCOPY WITH LATERAL MENISECTOMY;  Surgeon: Hiram Gash, MD;  Location: [place];  Service: Orthopedics;  Laterality: Left;  ? KNEE ARTHROSCOPY WITH MEDIAL MENISECTOMY Left 06/05/2021  ? Procedure: KNEE ARTHROSCOPY WITH MEDIAL MENISECTOMY;  Surgeon: Hiram Gash, MD;  Location: [place];  Service: Orthopedics;  Laterality: Left;  ? TUBAL LIGATION    ? ? ?There were no vitals filed for this visit. ? ? Subjective Assessment - 03/05/22 0937   ? ? Subjective "doing good no pain, some stiffness with sitting for too long but does go away after moving around."   ? Currently in Pain? No/denies   ? ?  ?  ? ?  ? ? ? ? ? ? ? ? ? ? ? ? ? ? ? ? ? ? ? ? ? ? ?[facility] Adult PT Treatment:                                                 DATE: 03/05/2022 ? ?Therapeutic Exercise: ?Treadmill L4.4 x 5 min  ?Slant board calf stretch 2 x 30 sec 1 x knee extended, 1 x knee bent ?Standing hamstring/ quad stretch 1 x 30 sec  ?Lateral band walks with GTB 2 x 10, sustained squat hovering above the table stepping (Table is cue to keep lower) ?Jump lunge x 5 ?Lateral jump lung x 5 ? ?Updated and extensively reviewed HEP with progression.  ? ?Therapeutic Activity: ?Triple hop test RLE 12.8 ft, LLE 33ft (81% of RLE) ? ? ? ? ? ? ? ? ? ? PT Short Term Goals - 01/01/22 0932   ? ?  ? PT SHORT TERM GOAL #1  ? Title The patient will be independent in an initial ACLR home exercise program.   ? Status Achieved   ?  ? PT SHORT TERM GOAL #2  ? Title The patient will present with improvement of left knee flexion active motion to 90 degrees for transfers and ambulation.   ? Status Achieved   ?  ? PT SHORT TERM GOAL #3  ? Title The  patient will be able to perform 30 reps of left SLR without an extensor lag for transition to waking without an AD.   ? Status Achieved   ? ?  ?  ? ?  ? ? ? ? PT Long Term Goals - 03/05/22 0949   ? ?  ? PT LONG TERM GOAL #1  ? Title The patient will have an improved FOTO score to 69% or better for return to premorbid activities.   ? Period Weeks   ? Status Achieved   ?  ? PT LONG TERM GOAL #2  ? Title The patient will be able to perform an eight inch step down with no compensation and good left knee eccentric control for stairs   ? Period Weeks   ?  ? PT LONG TERM GOAL #3  ? Title The patient will be able to perform a single leg stance on foam x 30 second or greater for return to community work with childern.   ? Status Achieved   ?  ? PT LONG TERM GOAL #4  ? Title The patient will be able to squat to full depth with equal weight bearing for lifting.   ? Period Weeks   ? Status Achieved   ?  ? PT LONG TERM GOAL #5  ? Title The patient will be able to complete a functonal hop test or Y balance testing and score a 90% or better for return to  recreational and community activities.   ? Status Achieved   ? ?  ?  ? ?  ? ? ? ? ? ? ? ? Plan - 03/05/22 1020   ? ? Clinical Impression Statement Megan Bryan has made excellent progress with physical therapy increasing knee ROM, strength and additionally reports no pain. she does report intermittent stiffness but notes it goes away with activity. She was able to perform all exercises well but does continue to demonstrate apprehension but otherwise demonstrates no functional limitations. Updated and extensively reviewed HEP and how it can be progressed. At this point Megan Bryan has met all goals and is able to maintain and progress her current LOF ind and will be discharged from PT today.   ? Examination-Activity Limitations Bathing;Locomotion Level;Transfers;Bed Mobility;Caring for Others;Carry;Sleep;Dressing;Lift;Stand;Stairs;Squat   ? Examination-Participation Restrictions Cleaning;Church;Meal Prep;Occupation;Driving;Yard Work;Volunteer;Laundry;Shop;Community Activity   ? PT Treatment/Interventions ADLs/Self Care Home Management;Aquatic Therapy;Cryotherapy;Electrical Stimulation;Moist Heat;Gait training;Stair training;Functional mobility training;Therapeutic activities;Therapeutic exercise;Balance training;Neuromuscular re-education;Manual techniques;Patient/family education;Passive range of motion;Dry needling;Joint Manipulations;Spinal Manipulations;Taping   ? PT Next Visit Plan D/C   ? PT Home Exercise Plan Access Code: [email or web address]   ? Consulted and Agree with Plan of Care Patient   ? ?  ?  ? ?  ? ? ?Patient will benefit from skilled therapeutic intervention in order to improve the following deficits and impairments:  Abnormal gait, Decreased range of motion, Difficulty walking, Pain, Decreased balance, Impaired flexibility, Improper body mechanics, Decreased mobility, Decreased strength, Increased edema ? ?Visit Diagnosis: ?Decreased range of motion of left knee ? ?Difficulty in walking, not elsewhere  classified ? ?Muscle weakness (generalized) ? ? ? ? ?Problem List ?Patient Active Problem List  ? Diagnosis Date Noted  ? BMI 27.0-27.9,adult   ? ?Chandler Stofer PT, DPT, LAT, ATC  ?03/05/22  10:24 AM ? ? ? ? ?[place] ?[facility] ?[address] ?[place], [place], [address] ?Phone: [phone]   Fax:  [phone] ? ?Name: Megan Bryan ?MRN: [id number] ?Date of Birth: January 23, 1981 ? ? ? ? ? ? ?PHYSICAL THERAPY  DISCHARGE SUMMARY ? ?Visits from Start of Care: 34 ? ?Current functional level related to goals / functional outcomes: ?See goals, ?  ?Remaining deficits: ?See assessment ?  ?Education / Equipment: ?HEP, theraband, posture, lifting mechanics.   ? ?Patient agrees to discharge. Patient goals were met. Patient is being discharged due to meeting the stated rehab goals. ? ? ? ? ? ?Starr Lake PT, DPT, LAT, ATC  ?03/05/22  10:24 AM ? ? ? ? ?
